# Patient Record
Sex: Male | Born: 1965 | Race: White | Hispanic: No | Smoking: Former smoker
Health system: Southern US, Community
[De-identification: ages and names within clinical notes are randomized; demographics above are authoritative.]

## PROBLEM LIST (undated history)

## (undated) DIAGNOSIS — Z8719 Personal history of other diseases of the digestive system: Secondary | ICD-10-CM

## (undated) DIAGNOSIS — E291 Testicular hypofunction: Secondary | ICD-10-CM

## (undated) DIAGNOSIS — N529 Male erectile dysfunction, unspecified: Secondary | ICD-10-CM

## (undated) DIAGNOSIS — G4730 Sleep apnea, unspecified: Secondary | ICD-10-CM

## (undated) DIAGNOSIS — M109 Gout, unspecified: Secondary | ICD-10-CM

## (undated) DIAGNOSIS — F411 Generalized anxiety disorder: Secondary | ICD-10-CM

## (undated) DIAGNOSIS — G4733 Obstructive sleep apnea (adult) (pediatric): Secondary | ICD-10-CM

## (undated) DIAGNOSIS — I428 Other cardiomyopathies: Secondary | ICD-10-CM

## (undated) DIAGNOSIS — I4819 Other persistent atrial fibrillation: Secondary | ICD-10-CM

## (undated) HISTORY — DX: Male erectile dysfunction, unspecified: N52.9

## (undated) HISTORY — DX: Gout, unspecified: M10.9

## (undated) HISTORY — DX: Generalized anxiety disorder: F41.1

## (undated) HISTORY — DX: Testicular hypofunction: E29.1

## (undated) HISTORY — PX: HERNIA REPAIR: SHX51

## (undated) HISTORY — DX: Other persistent atrial fibrillation: I48.19

## (undated) HISTORY — DX: Sleep apnea, unspecified: G47.30

## (undated) HISTORY — DX: Personal history of other diseases of the digestive system: Z87.19

## (undated) HISTORY — DX: Obstructive sleep apnea (adult) (pediatric): G47.33

---

## 2006-02-28 ENCOUNTER — Encounter: Admission: RE | Admit: 2006-02-28 | Discharge: 2006-02-28 | Payer: Self-pay | Admitting: Occupational Medicine

## 2009-03-05 ENCOUNTER — Ambulatory Visit (HOSPITAL_COMMUNITY): Admission: RE | Admit: 2009-03-05 | Discharge: 2009-03-05 | Payer: Self-pay | Admitting: Surgery

## 2009-03-21 ENCOUNTER — Encounter: Admission: RE | Admit: 2009-03-21 | Discharge: 2009-03-21 | Payer: Self-pay | Admitting: Occupational Medicine

## 2009-06-18 ENCOUNTER — Ambulatory Visit (HOSPITAL_COMMUNITY): Admission: RE | Admit: 2009-06-18 | Discharge: 2009-06-18 | Payer: Self-pay | Admitting: Surgery

## 2009-06-30 ENCOUNTER — Inpatient Hospital Stay (HOSPITAL_COMMUNITY): Admission: EM | Admit: 2009-06-30 | Discharge: 2009-07-03 | Payer: Self-pay | Admitting: Emergency Medicine

## 2010-12-10 LAB — URINE MICROSCOPIC-ADD ON

## 2010-12-10 LAB — URINALYSIS, ROUTINE W REFLEX MICROSCOPIC
Glucose, UA: NEGATIVE mg/dL
Leukocytes, UA: NEGATIVE
Nitrite: NEGATIVE
Specific Gravity, Urine: 1.03 (ref 1.005–1.030)
Urobilinogen, UA: 1 mg/dL (ref 0.0–1.0)

## 2010-12-10 LAB — DIFFERENTIAL
Basophils Absolute: 0 10*3/uL (ref 0.0–0.1)
Basophils Absolute: 0.1 10*3/uL (ref 0.0–0.1)
Basophils Relative: 0 % (ref 0–1)
Basophils Relative: 0 % (ref 0–1)
Basophils Relative: 1 % (ref 0–1)
Eosinophils Relative: 1 % (ref 0–5)
Lymphocytes Relative: 36 % (ref 12–46)
Lymphocytes Relative: 36 % (ref 12–46)
Lymphs Abs: 0.6 10*3/uL — ABNORMAL LOW (ref 0.7–4.0)
Monocytes Absolute: 0.6 10*3/uL (ref 0.1–1.0)
Monocytes Relative: 5 % (ref 3–12)
Neutro Abs: 4.9 10*3/uL (ref 1.7–7.7)
Neutro Abs: 3.3 10*3/uL (ref 1.7–7.7)
Neutrophils Relative %: 73 % (ref 43–77)

## 2010-12-10 LAB — CBC
HCT: 40.1 % (ref 39.0–52.0)
HCT: 40.7 % (ref 39.0–52.0)
HCT: 46.6 % (ref 39.0–52.0)
Hemoglobin: 13.9 g/dL (ref 13.0–17.0)
Hemoglobin: 14 g/dL (ref 13.0–17.0)
Hemoglobin: 16.5 g/dL (ref 13.0–17.0)
Hemoglobin: 15.7 g/dL (ref 13.0–17.0)
MCHC: 34.8 g/dL (ref 30.0–36.0)
MCHC: 35 g/dL (ref 30.0–36.0)
MCV: 87.2 fL (ref 78.0–100.0)
Platelets: 83 10*3/uL — ABNORMAL LOW (ref 150–400)
Platelets: 91 10*3/uL — ABNORMAL LOW (ref 150–400)
Platelets: 189 10*3/uL (ref 150–400)
RBC: 4.67 MIL/uL (ref 4.22–5.81)
RBC: 4.68 MIL/uL (ref 4.22–5.81)
RBC: 5.45 MIL/uL (ref 4.22–5.81)
RDW: 11.9 % (ref 11.5–15.5)
RDW: 12.7 % (ref 11.5–15.5)
WBC: 1.7 10*3/uL — ABNORMAL LOW (ref 4.0–10.5)
WBC: 7.7 10*3/uL (ref 4.0–10.5)
WBC: 8.6 10*3/uL (ref 4.0–10.5)
WBC: 6.4 10*3/uL (ref 4.0–10.5)

## 2010-12-10 LAB — COMPREHENSIVE METABOLIC PANEL
Albumin: 3.2 g/dL — ABNORMAL LOW (ref 3.5–5.2)
Albumin: 3.2 g/dL — ABNORMAL LOW (ref 3.5–5.2)
Alkaline Phosphatase: 43 U/L (ref 39–117)
Alkaline Phosphatase: 55 U/L (ref 39–117)
Alkaline Phosphatase: 61 U/L (ref 39–117)
BUN: 10 mg/dL (ref 6–23)
BUN: 10 mg/dL (ref 6–23)
BUN: 21 mg/dL (ref 6–23)
CO2: 28 mEq/L (ref 19–32)
CO2: 31 mEq/L (ref 19–32)
CO2: 32 mEq/L (ref 19–32)
Chloride: 105 mEq/L (ref 96–112)
Chloride: 99 mEq/L (ref 96–112)
Creatinine, Ser: 0.94 mg/dL (ref 0.4–1.5)
Creatinine, Ser: 1.02 mg/dL (ref 0.4–1.5)
GFR calc non Af Amer: 59 mL/min — ABNORMAL LOW (ref >60)
GFR calc non Af Amer: >60 mL/min (ref >60)
Glucose, Bld: 113 mg/dL — ABNORMAL HIGH (ref 70–99)
Glucose, Bld: 130 mg/dL — ABNORMAL HIGH (ref 70–99)
Potassium: 3.8 mEq/L (ref 3.5–5.1)
Potassium: 4 mEq/L (ref 3.5–5.1)
Potassium: 4.5 mEq/L (ref 3.5–5.1)
Sodium: 137 mEq/L (ref 135–145)
Sodium: 144 mEq/L (ref 135–145)
Total Bilirubin: 0.5 mg/dL (ref 0.3–1.2)
Total Bilirubin: 0.5 mg/dL (ref 0.3–1.2)
Total Protein: 6 g/dL (ref 6.0–8.3)

## 2010-12-10 LAB — ROCKY MTN SPOTTED FVR AB, IGM-BLOOD
RMSF IgM: 0.3 IV (ref 0.00–0.89)
RMSF IgM: 0.33 IV (ref 0.00–0.89)

## 2010-12-10 LAB — BASIC METABOLIC PANEL
BUN: 13 mg/dL (ref 6–23)
BUN: 18 mg/dL (ref 6–23)
CO2: 30 mEq/L (ref 19–32)
Calcium: 8.1 mg/dL — ABNORMAL LOW (ref 8.4–10.5)
Calcium: 9.4 mg/dL (ref 8.4–10.5)
GFR calc Af Amer: >60 mL/min (ref >60)
GFR calc non Af Amer: 49 mL/min — ABNORMAL LOW (ref >60)
Glucose, Bld: 119 mg/dL — ABNORMAL HIGH (ref 70–99)
Glucose, Bld: 103 mg/dL — ABNORMAL HIGH (ref 70–99)
Potassium: 3.7 mEq/L (ref 3.5–5.1)
Sodium: 139 mEq/L (ref 135–145)
Sodium: 140 mEq/L (ref 135–145)

## 2010-12-10 LAB — CULTURE, BLOOD (ROUTINE X 2): Culture: NO GROWTH

## 2010-12-10 LAB — MAGNESIUM
Magnesium: 2 mg/dL (ref 1.5–2.5)
Magnesium: 2.1 mg/dL (ref 1.5–2.5)

## 2010-12-10 LAB — PROTIME-INR: INR: 0.85 (ref 0.00–1.49)

## 2010-12-10 LAB — URINE CULTURE: Culture: NO GROWTH

## 2010-12-14 LAB — CBC
HCT: 47.7 % (ref 39.0–52.0)
Hemoglobin: 16.4 g/dL (ref 13.0–17.0)
MCV: 86.5 fL (ref 78.0–100.0)
RBC: 5.51 MIL/uL (ref 4.22–5.81)
WBC: 7.2 10*3/uL (ref 4.0–10.5)

## 2010-12-14 LAB — BASIC METABOLIC PANEL
Chloride: 103 mEq/L (ref 96–112)
GFR calc Af Amer: >60 mL/min (ref >60)
Potassium: 4.5 mEq/L (ref 3.5–5.1)

## 2010-12-14 LAB — DIFFERENTIAL
Eosinophils Absolute: 0.2 10*3/uL (ref 0.0–0.7)
Eosinophils Relative: 2 % (ref 0–5)
Lymphs Abs: 2.6 10*3/uL (ref 0.7–4.0)
Monocytes Relative: 8 % (ref 3–12)

## 2011-01-19 NOTE — Op Note (Signed)
NAME:  Larry Evans, Larry Evans NO.:  [id number]   MEDICAL RECORD NO.:  [id number]          PATIENT TYPE:  AMB   LOCATION:  SDS                          FACILITY:  [facility]   PHYSICIAN:  Wilmon Arms. Corliss Skains, M.D. DATE OF BIRTH:  Jun 21, 1966   DATE OF PROCEDURE:  03/05/2009  DATE OF DISCHARGE:                               OPERATIVE REPORT   PREOPERATIVE DIAGNOSIS:  Right inguinal hernia.   POSTOPERATIVE DIAGNOSIS:  Right pantaloon inguinal hernia.   PROCEDURE PERFORMED:  Right inguinal hernia repair with mesh.   SURGEON:  Wilmon Arms. Tsuei, MD   ANESTHESIA:  General endotracheal.  No assistance.   INDICATIONS:  This is a 45 year old male who presents with at least a 4-  year history of a visible palpable bulge in his right groin.  Unfortunately, the patient did not have this attended to and the hernia  has gotten much larger and is protruding down into his scrotum.  He  presents now for elective repair.   DESCRIPTION OF PROCEDURE:  The patient was brought to the operating  room, placed in supine position on the operating room table.  After an  adequate level of general anesthesia was obtained, the patient's right  groin was shaved, prepped with Betadine and draped in sterile fashion.  A time-out was taken to assure the proper patient and proper procedure.  We infiltrated the area above the right inguinal ligament with  0.25%Marcaine with epinephrine.  We made an oblique incision above the  inguinal ligament.  Dissection was carried down through the subcutaneous  tissues to the external oblique fascia.  The fascia was opened along  direction of its fibers down the external ring.  We bluntly dissected  around the very large spermatic cord.  There was an obvious large direct  hernia sac protruding up to the floor of the inguinal canal.  The  spermatic cord was retracted with a Penrose drain.  We dissected the  direct hernia sac free and reduced it back up to the direct defect  in  the floor of the inguinal canal.  The spermatic cord was then  skeletonized and very large indirect hernia sac was also reduced along  with a cord lipoma.  We did reduce this up to the internal ring.  The  floor of the inguinal canal was then reapproximated and reinforced with  a running 0 PDS suture.  The internal inguinal ring was tightened with a  2-0 Vicryl.  UltraPro mesh was cut into a keyhole shape and was secured  with 2-0 Prolene beginning at the pubic tubercle running it along the  shelving edge inferiorly and the internal oblique fascia superiorly.  The tails were sutured together behind the spermatic cord and tucked  underneath the external oblique fascia.  The fascia was reapproximated  with 2-0 Vicryl.  A 3-0 Vicryl was used to close subcutaneous tissues  and 4-0 Monocryl was used to close the skin.  Steri-Strips and clean  dressings were applied.  The patient was then extubated and brought to  recovery room in stable condition.  All sponge, instrument, and needle  counts  were correct.     Wilmon Arms. Tsuei, M.D.  Electronically Signed    MKT/MEDQ  D:  03/05/2009  T:  03/06/2009  Job:  [id number]

## 2011-03-30 IMAGING — US US SCROTUM
1 series · 14 of 25 positions shown · non-contrast
Comparison: CT scan earlier today

CLINICAL DATA: Fever, weakness previous right hernia repair.

SCROTAL ULTRASOUND
DOPPLER ULTRASOUND OF THE TESTICLES
TECHNIQUE: Complete ultrasound examination of the testicles,
epididymis, and other scrotal structures was performed.  Color and
spectral Doppler ultrasound were also utilized to evaluate blood
flow to the testicles.

[Series 1: us scrotum · 0.05mm/px · 14 of 57 slices shown]
[im 1/57]
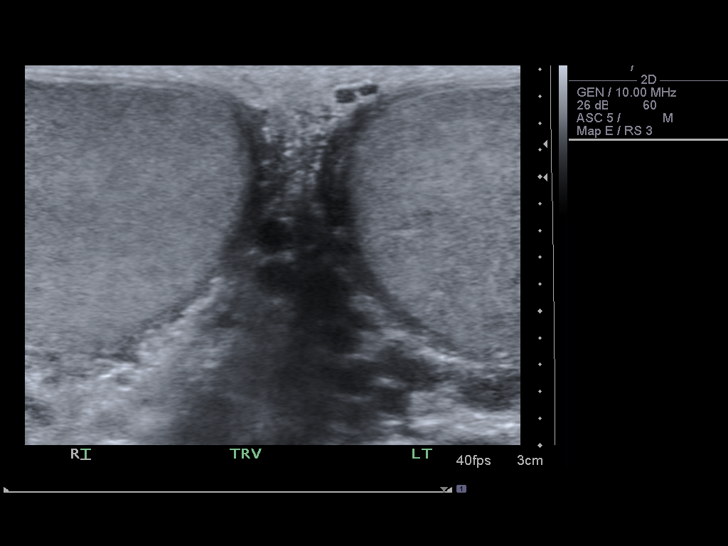
[im 5/57]
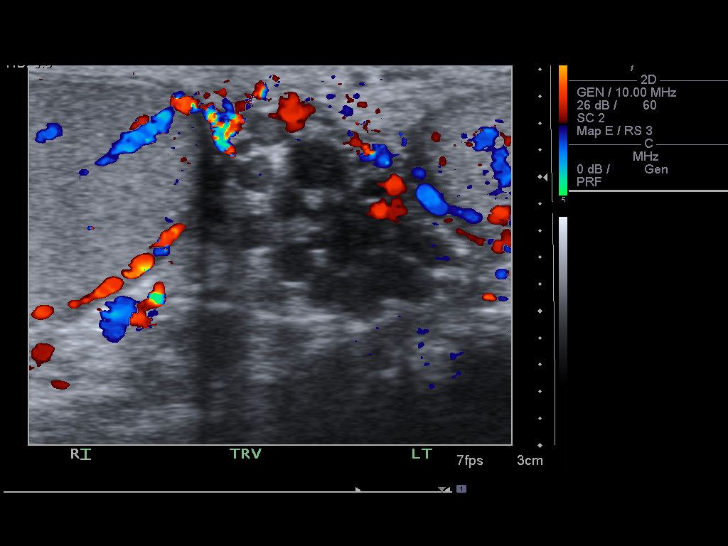
[im 10/57]
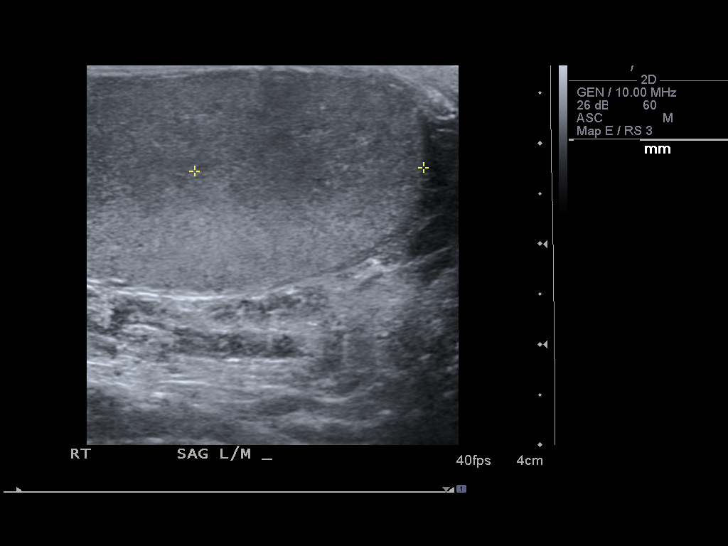
[im 15/57]
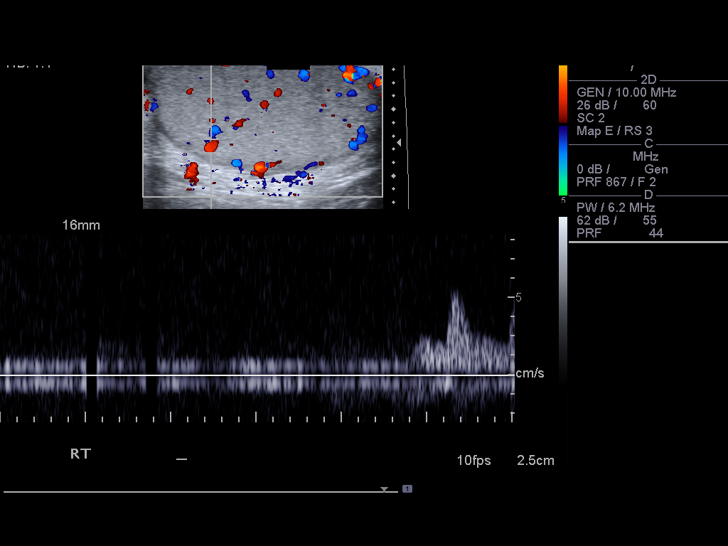
[im 19/57]
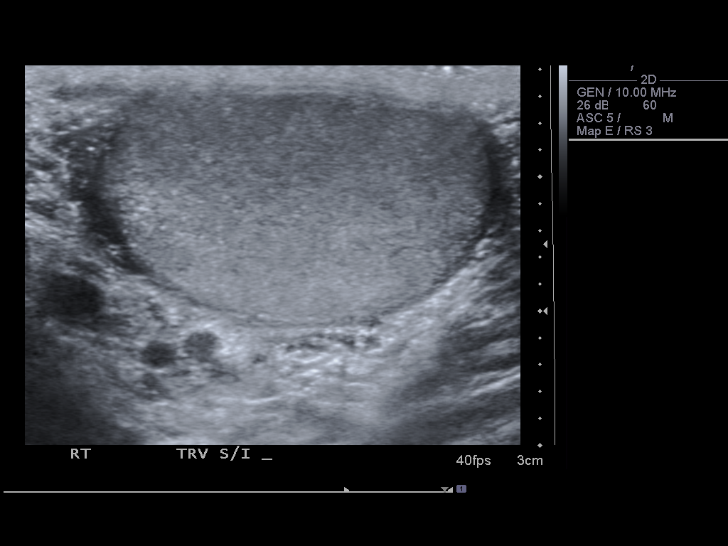
[im 22/57]
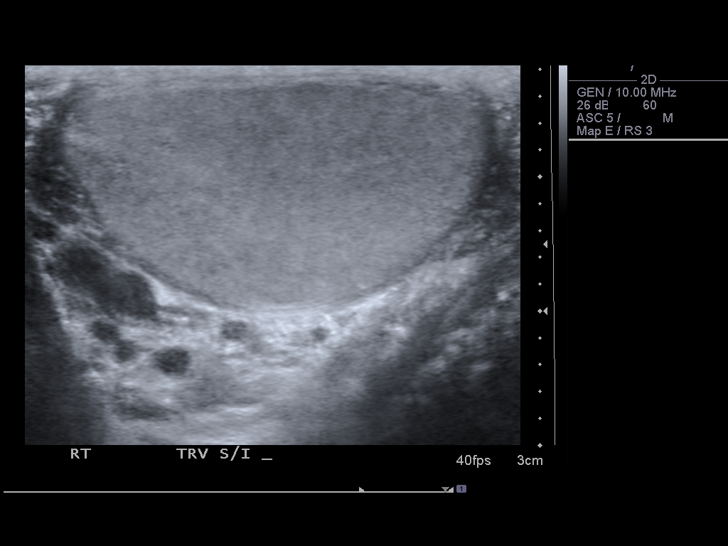
[im 26/57]
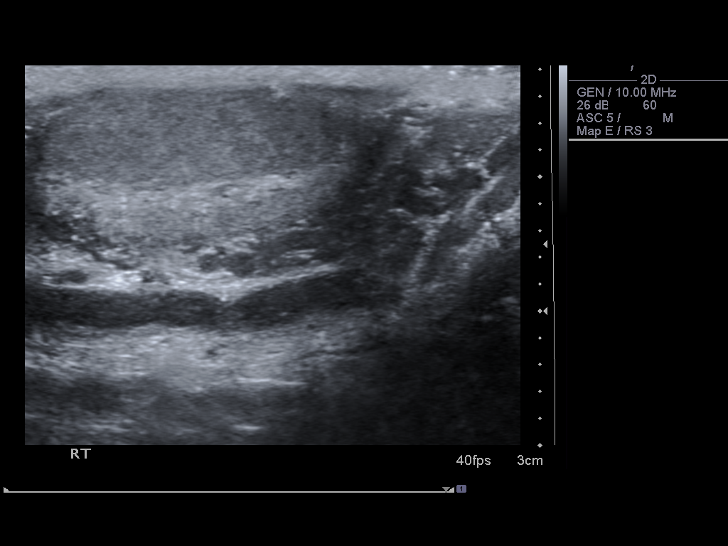
[im 31/57]
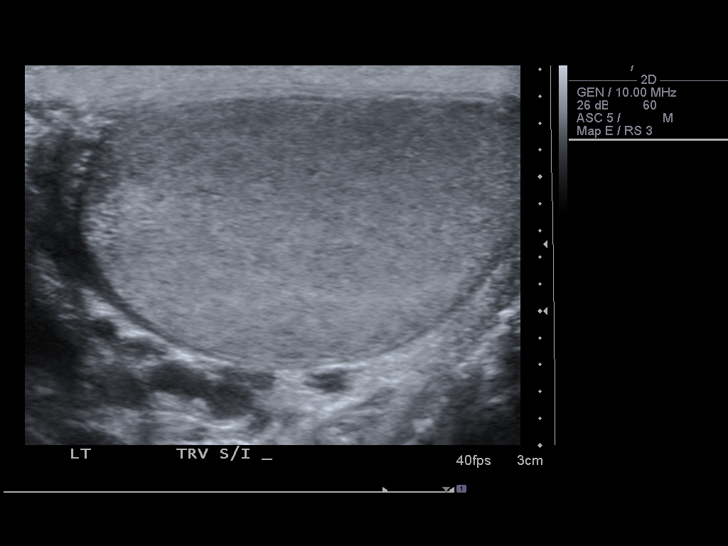
[im 36/57]
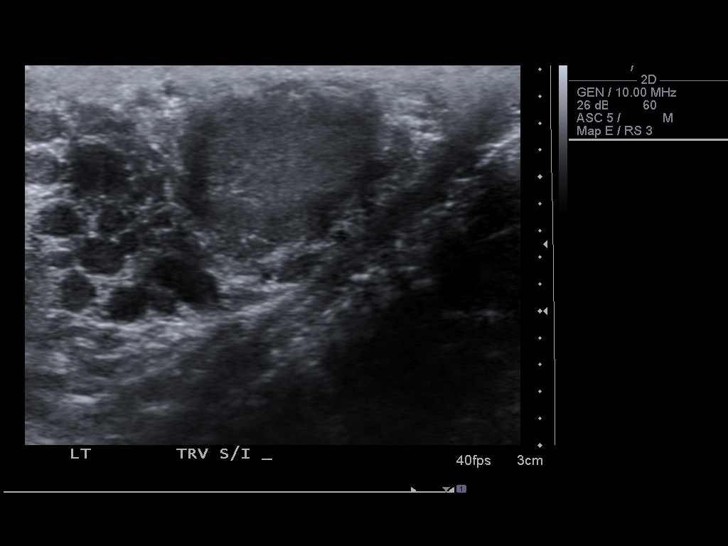
[im 38/57]
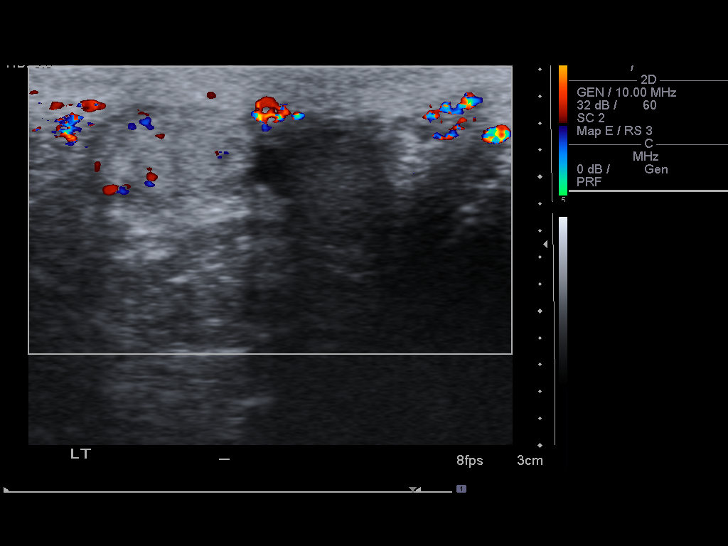
[im 43/57]
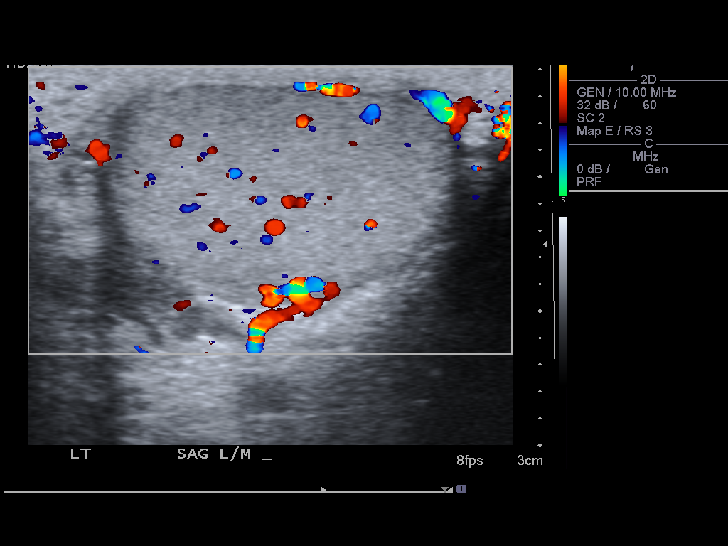
[im 47/57]
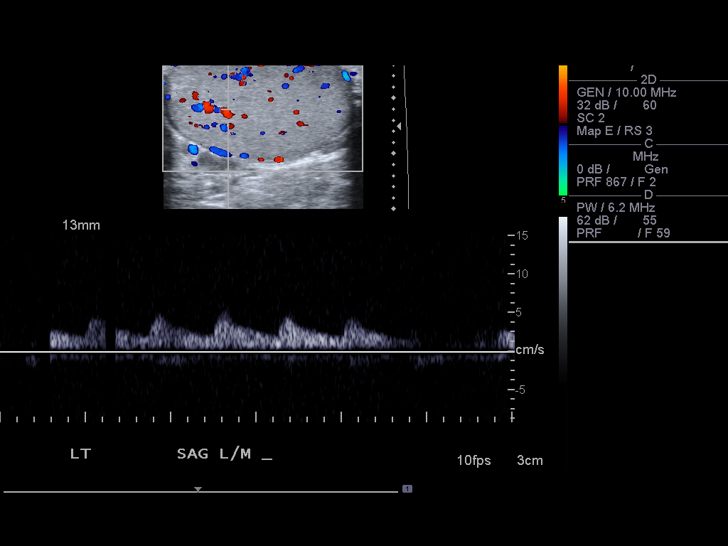
[im 52/57]
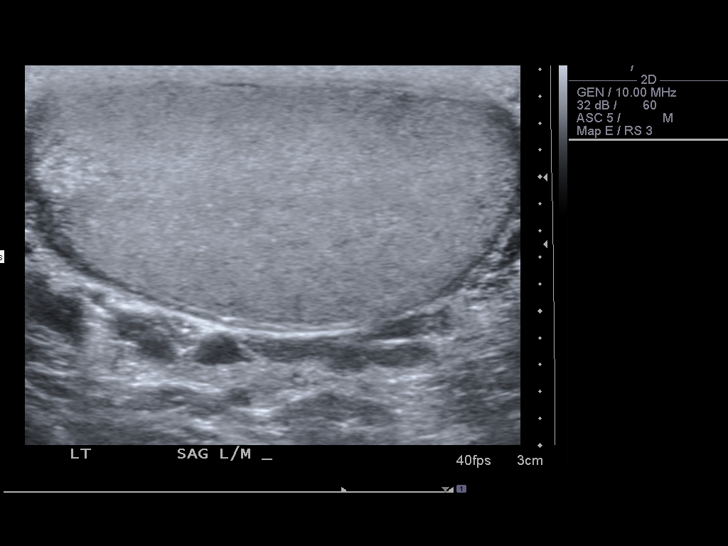
[im 57/57]
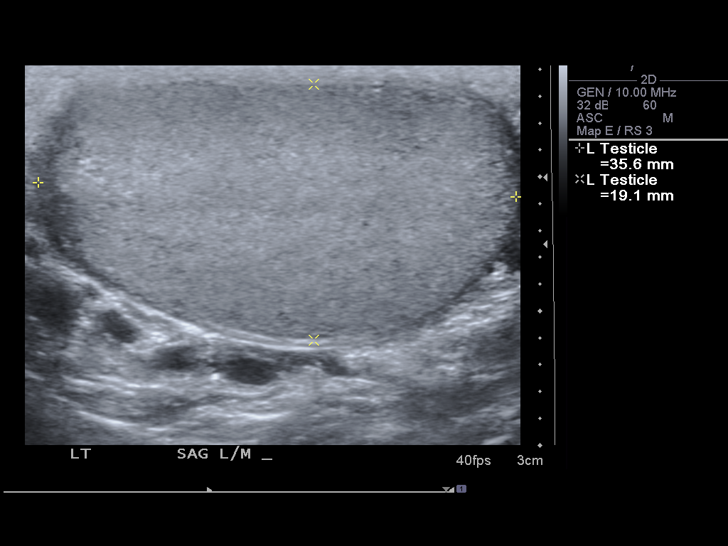

[14 of 25 positions shown; findings below may reference images not displayed]

FINDINGS: The right testicle is slightly larger than the left but
there are no focal masses.  Both testicle demonstrate normal
internal architecture.  There is no evidence for increased or
decreased flow on color Doppler within the testicles to suggest
orchitis or torsion.

Numerous vascular channels are seen between the testicles which do
not demonstrate augmented flow on Doppler.  I feel this is likely
to represent a static varicocele.  Other considerations would
include dilated lymphatic channels.  Correlate clinically.

Small hydrocele on the right.  No evidence for epididymitis.
IMPRESSION: No evidence for orchitis or torsion.

Dilated vascular channels without demonstrable color flow may
represent static varicocele.  Correlate clinically.

Small hydrocele on the right.

## 2012-02-02 ENCOUNTER — Other Ambulatory Visit: Payer: Self-pay | Admitting: Internal Medicine

## 2013-08-31 ENCOUNTER — Encounter (INDEPENDENT_AMBULATORY_CARE_PROVIDER_SITE_OTHER): Payer: Self-pay

## 2013-08-31 ENCOUNTER — Ambulatory Visit (INDEPENDENT_AMBULATORY_CARE_PROVIDER_SITE_OTHER): Payer: Managed Care, Other (non HMO) | Admitting: Cardiology

## 2013-08-31 ENCOUNTER — Encounter: Payer: Self-pay | Admitting: Cardiology

## 2013-08-31 VITALS — BP 132/100 | HR 50 | Ht 72.0 in | Wt 241.0 lb

## 2013-08-31 DIAGNOSIS — I480 Paroxysmal atrial fibrillation: Secondary | ICD-10-CM | POA: Insufficient documentation

## 2013-08-31 DIAGNOSIS — I4891 Unspecified atrial fibrillation: Secondary | ICD-10-CM

## 2013-08-31 LAB — CBC WITH DIFFERENTIAL/PLATELET
Basophils Relative: 0.6 % (ref 0.0–3.0)
Eosinophils Absolute: 0.3 10*3/uL (ref 0.0–0.7)
MCHC: 33.7 g/dL (ref 30.0–36.0)
MCV: 88.4 fl (ref 78.0–100.0)
Monocytes Absolute: 0.6 10*3/uL (ref 0.1–1.0)
Neutrophils Relative %: 55.3 % (ref 43.0–77.0)
Platelets: 218 10*3/uL (ref 150.0–400.0)
RBC: 5.64 Mil/uL (ref 4.22–5.81)

## 2013-08-31 LAB — BASIC METABOLIC PANEL
BUN: 17 mg/dL (ref 6–23)
Calcium: 9.6 mg/dL (ref 8.4–10.5)
Creatinine, Ser: 1.1 mg/dL (ref 0.4–1.5)
GFR: 79.01 mL/min (ref >60.00)
Potassium: 4.1 mEq/L (ref 3.5–5.1)

## 2013-08-31 LAB — HEPATIC FUNCTION PANEL
AST: 20 U/L (ref 0–37)
Total Bilirubin: 0.9 mg/dL (ref 0.3–1.2)

## 2013-08-31 LAB — TSH: TSH: 2.52 u[IU]/mL (ref 0.35–5.50)

## 2013-08-31 MED ORDER — FUROSEMIDE 20 MG PO TABS: 20.0000 mg | ORAL_TABLET | Freq: Once | ORAL | Status: DC

## 2013-08-31 MED ORDER — METOPROLOL SUCCINATE ER 50 MG PO TB24: 50.0000 mg | ORAL_TABLET | Freq: Every day | ORAL | Status: DC

## 2013-08-31 MED ORDER — APIXABAN 5 MG PO TABS: 5.0000 mg | ORAL_TABLET | Freq: Two times a day (BID) | ORAL | Status: DC

## 2013-08-31 NOTE — Patient Instructions (Signed)
Your physician recommends that you schedule a follow-up appointment in: ONE WEEK WITH EXTENDER OR DOD TO CHECK HEART RATE  Your physician recommends that you schedule a follow-up appointment in: 4 WEEKS WITH DR Jens Som  Your physician has requested that you have an echocardiogram. Echocardiography is a painless test that uses sound waves to create images of your heart. It provides your doctor with information about the size and shape of your heart and how well your heart's chambers and valves are working. This procedure takes approximately one hour. There are no restrictions for this procedure.   Your physician recommends that you HAVE LAB WORK TODAY  START METOPROLOL SUCC ER 50 MG ONCE DAILY  TAKE FUROSEMIDE 20 MG TODAY  START ELIQUIS 5 MG ONE TABLET TWICE DAILY

## 2013-08-31 NOTE — Progress Notes (Signed)
     HPI: 47 year old male for evaluation of atrial fibrillation. No prior cardiac history. Patient has had intermittent palpitations for approximately 10 years. They're not related to activities. It typically lasts approximately 15 minutes and resolve with deep breaths and rest. He developed recurrent palpitations approximately 7 days ago. He also has noticed mild increased dyspnea on exertion, orthopnea. He has not had chest pain. He presented to her primary care and was noted to be in atrial fibrillation and we were asked to evaluate. He otherwise does not have dyspnea on exertion, orthopnea, PND, pedal edema, syncope or exertional chest pain. No history of bleeding.  No current outpatient prescriptions on file.   No current facility-administered medications for this visit.    No Known Allergies  History reviewed. No pertinent past medical history.  Past Surgical History  Procedure Laterality Date  . Hernia repair      History   Social History  . Marital Status: Divorced    Spouse Name: N/A    Number of Children: 1  . Years of Education: N/A   Occupational History  .      Educational psychologist   Social History Main Topics  . Smoking status: Former Games developer  . Smokeless tobacco: Not on file  . Alcohol Use: Yes     Comment: occasionally  . Drug Use: No  . Sexual Activity: Not on file   Other Topics Concern  . Not on file   Social History Narrative  . No narrative on file    Family History  Problem Relation Age of Onset  . Heart disease      No family history    ROS: no fevers or chills, productive cough, hemoptysis, dysphasia, odynophagia, melena, hematochezia, dysuria, hematuria, rash, seizure activity, orthopnea, PND, pedal edema, claudication. Remaining systems are negative.  Physical Exam:   Blood pressure 132/100, pulse 50, height 6' (1.829 m), weight 241 lb (109.317 kg), SpO2 97.00%.  General:  Well developed/well nourished in NAD Skin warm/dry Patient not  depressed No peripheral clubbing Back-normal HEENT-normal/normal eyelids Neck supple/normal carotid upstroke bilaterally; no bruits; no JVD; no thyromegaly chest - CTA/ normal expansion CV - tachycardic and irregular/normal S1 and S2; no murmurs, rubs or gallops;  PMI nondisplaced Abdomen -NT/ND, no HSM, no mass, + bowel sounds, no bruit 2+ femoral pulses, no bruits Ext-no edema, chords, 2+ DP Neuro-grossly nonfocal  ECG atrial fibrillation with a rapid ventricular response.

## 2013-08-31 NOTE — Assessment & Plan Note (Signed)
Patient presents with newly diagnosed atrial fibrillation. Plan echocardiogram to assess LV function. Check TSH, CBC and CMET. His rate is mildly increased. Add Toprol 50 mg daily. He has mild dyspnea and orthopnea but does not appear to be significantly volume overloaded. I will give Lasix 20 mg by mouth x1. His symptoms should improve with rate control. He has no embolic risk factors. I will begin apixaban 5 mg by mouth twice a day in case he does not convert on his own. If not we will plan to proceed with cardioversion 3 weeks after initiating anticoagulation and continuing apixaban for 4 weeks afterwards. I do not think he will require long-term anticoagulation unless his LV function is reduced on echocardiogram. He does consume significant amounts of alcohol. I have explained this could be contributing to his atrial fibrillation and asked him to decrease his intake. I will have him seen next week to have his blood pressure checked and his heart rate. Note he also apparently snores significantly. This could also be contributing to atrial fibrillation. We will arrange outpatient pulmonary evaluation to rule out sleep apnea once his atrial fibrillation is treated.

## 2013-09-03 ENCOUNTER — Encounter: Payer: Self-pay | Admitting: *Deleted

## 2013-09-11 ENCOUNTER — Ambulatory Visit (INDEPENDENT_AMBULATORY_CARE_PROVIDER_SITE_OTHER): Payer: Managed Care, Other (non HMO) | Admitting: Physician Assistant

## 2013-09-11 ENCOUNTER — Encounter: Payer: Self-pay | Admitting: Physician Assistant

## 2013-09-11 VITALS — BP 132/100 | HR 127 | Ht 72.0 in | Wt 245.0 lb

## 2013-09-11 DIAGNOSIS — R0989 Other specified symptoms and signs involving the circulatory and respiratory systems: Secondary | ICD-10-CM

## 2013-09-11 DIAGNOSIS — R0609 Other forms of dyspnea: Secondary | ICD-10-CM

## 2013-09-11 DIAGNOSIS — R0683 Snoring: Secondary | ICD-10-CM

## 2013-09-11 DIAGNOSIS — I4891 Unspecified atrial fibrillation: Secondary | ICD-10-CM

## 2013-09-11 MED ORDER — METOPROLOL SUCCINATE ER 50 MG PO TB24: 50.0000 mg | ORAL_TABLET | Freq: Two times a day (BID) | ORAL | Status: DC

## 2013-09-11 NOTE — Patient Instructions (Addendum)
INCREASE YOUR TOPROL (METOPROLOL) TO 50 MG TWICE A DAY  You are scheduled to see Dr Gwenette Greet on 10-09-13 at 2:00. They are located at [address], phone number [phone] Please arrive at least 15 minutes early with a list of your medications and your insurance cards  You will see Nicki Reaper next week when you come for your Echo

## 2013-09-11 NOTE — Progress Notes (Signed)
[address] [place], [place]  [address] Phone: [phone] Fax:  [phone]  Date:  09/11/2013   ID:  Larry Evans, Larry Evans 1966-08-18, MRN [id number]  PCP:  Milagros Evener, MD  Cardiologist:  Dr. Kirk Ruths     History of Present Illness: Larry Evans is a 48 y.o. male who was recently dx with atrial fibrillation. He has no prior cardiac history. Patient has had intermittent palpitations for approximately 10 years. They're not related to activities. It typically lasts approximately 15 minutes and resolve with deep breaths and rest. He developed recurrent palpitations recently along with mild increased dyspnea on exertion, orthopnea.  He presented to primary care and was noted to be in atrial fibrillation.  He saw Dr. Kirk Ruths 08/31/13.  He was placed on Toprol for rate control. He was placed on Apixaban for anticoagulation. He was given one dose of Lasix for volume excess. Plan is to eventually proceed with cardioversion after 3 weeks of uninterrupted anticoagulation. Of note, TSH was normal. He has an echocardiogram pending. He will eventually need outpatient evaluation for sleep apnea.  He is feeling better. His breathing has improved. He describes NYHA class II symptoms. He denies orthopnea or PND. He denies edema. He denies chest pain. He denies syncope or near-syncope. He does note his heart rate is elevated at times.  Recent Labs: 08/31/2013: ALT 30; Creatinine 1.1; Hemoglobin 16.8; Potassium 4.1; TSH 2.52   Wt Readings from Last 3 Encounters:  09/11/13 245 lb (111.131 kg)  08/31/13 241 lb (109.317 kg)     Past Medical History  Diagnosis Date  . Atrial fibrillation     a. dx 08/2013 => Apixaban started 08/31/13    Current Outpatient Prescriptions  Medication Sig Dispense Refill  . apixaban (ELIQUIS) 5 MG TABS tablet Take 1 tablet (5 mg total) by mouth 2 (two) times daily.  60 tablet  6  . furosemide (LASIX) 20 MG tablet Take 1 tablet (20 mg  total) by mouth once.  1 tablet  0  . metoprolol succinate (TOPROL-XL) 50 MG 24 hr tablet Take 1 tablet (50 mg total) by mouth daily. Take with or immediately following a meal.  30 tablet  12   No current facility-administered medications for this visit.    Allergies:   Review of patient's allergies indicates no known allergies.   Social History:  The patient  reports that he has quit smoking. He does not have any smokeless tobacco history on file. He reports that he drinks alcohol. He reports that he does not use illicit drugs.   Family History:  The patient's family history includes Heart disease in an other family member.   ROS:  Please see the history of present illness.   He denies any bleeding problems.   All other systems reviewed and negative.   PHYSICAL EXAM: VS:  BP 132/100  Pulse 127  Ht 6' (1.829 m)  Wt 245 lb (111.131 kg)  BMI 33.22 kg/m2 Well nourished, well developed, in no acute distress HEENT: normal Neck: no JVD Cardiac:  normal S1, S2; irregularly irregular rhythm; no murmur Lungs:  clear to auscultation bilaterally, no wheezing, rhonchi or rales Abd: soft, nontender, no hepatomegaly Ext: no edema Skin: warm and dry Neuro:  CNs 2-12 intact, no focal abnormalities noted  EKG:  Atrial fibrillation, HR 127, normal axis     ASSESSMENT AND PLAN:  1. Atrial Fibrillation:  Heart rate is still uncontrolled. He has symptomatically improved. I will increase  his Toprol to 50 mg twice a day. He will proceed with echocardiogram next week as planned. Volume status appears stable at this time. He remains on Apixaban. We discussed the importance of uninterrupted anticoagulation before proceeding with cardioversion.  BP has been significantly elevated his last 2 visits.  HTN may be one of his risk factors (ie CHADS2-VASc=1).  Continue to monitor.   2. Snoring: He likely has significant sleep apnea. I will refer him to pulmonology for further evaluation. 3. Disposition:  Follow  up in one week to recheck his heart rate. Keep follow up with Dr. Stanford Breed 1/22 as planned.  Signed, Richardson Dopp, PA-C  09/11/2013 8:55 AM

## 2013-09-18 ENCOUNTER — Ambulatory Visit (HOSPITAL_COMMUNITY): Payer: Managed Care, Other (non HMO) | Attending: Cardiology | Admitting: Radiology

## 2013-09-18 ENCOUNTER — Encounter: Payer: Self-pay | Admitting: Cardiovascular Disease

## 2013-09-18 ENCOUNTER — Ambulatory Visit (INDEPENDENT_AMBULATORY_CARE_PROVIDER_SITE_OTHER): Payer: Managed Care, Other (non HMO) | Admitting: Physician Assistant

## 2013-09-18 ENCOUNTER — Encounter: Payer: Self-pay | Admitting: Physician Assistant

## 2013-09-18 VITALS — BP 130/82 | HR 132 | Ht 72.0 in | Wt 243.0 lb

## 2013-09-18 DIAGNOSIS — R0683 Snoring: Secondary | ICD-10-CM

## 2013-09-18 DIAGNOSIS — M25579 Pain in unspecified ankle and joints of unspecified foot: Secondary | ICD-10-CM

## 2013-09-18 DIAGNOSIS — M25572 Pain in left ankle and joints of left foot: Secondary | ICD-10-CM

## 2013-09-18 DIAGNOSIS — R0989 Other specified symptoms and signs involving the circulatory and respiratory systems: Secondary | ICD-10-CM

## 2013-09-18 DIAGNOSIS — I4891 Unspecified atrial fibrillation: Secondary | ICD-10-CM | POA: Insufficient documentation

## 2013-09-18 DIAGNOSIS — R0609 Other forms of dyspnea: Secondary | ICD-10-CM

## 2013-09-18 MED ORDER — FUROSEMIDE 20 MG PO TABS: 20.0000 mg | ORAL_TABLET | Freq: Once | ORAL | Status: DC

## 2013-09-18 MED ORDER — DILTIAZEM HCL ER COATED BEADS 120 MG PO CP24: 120.0000 mg | ORAL_CAPSULE | Freq: Every day | ORAL | Status: DC

## 2013-09-18 NOTE — Progress Notes (Signed)
Echocardiogram performed.  

## 2013-09-18 NOTE — Progress Notes (Signed)
[address], [place], [place]  [address] Phone: [phone] Fax:  [phone]  Date:  09/18/2013   ID:  Larry Evans, Larry Evans [birth year], MRN [id number]  PCP:  Milagros Evener, MD  Cardiologist:  Dr. Kirk Ruths     History of Present Illness: Larry Evans is a 48 y.o. male who was recently dx with atrial fibrillation. He has no prior cardiac history.   He saw Dr. Kirk Ruths 08/31/13.  He was placed on Toprol for rate control. He was placed on Apixaban for anticoagulation. He was given one dose of Lasix for volume excess. Plan is to eventually proceed with cardioversion after 3 weeks of uninterrupted anticoagulation. Of note, TSH was normal. He has an echocardiogram pending.  I saw him last week. His heart rate was still uncontrolled. I adjusted his Toprol. I referred him to pulmonology for evaluation of sleep apnea.  He returns for follow up.  He notes some increased dyspnea over the last day. No orthopnea or PND.  He has a mild NP cough.  No chest pain. No syncope.  He notes L ankle edema and pain.  This occurs from time to time and lasts about 3 days.  He has refrained form ETOH since seeing Dr. Kirk Ruths.  He is compliant with Toprol and Apixaban.    Recent Labs: 08/31/2013: ALT 30; Creatinine 1.1; Hemoglobin 16.8; Potassium 4.1; TSH 2.52   Wt Readings from Last 3 Encounters:  09/18/13 243 lb (110.224 kg)  09/11/13 245 lb (111.131 kg)  08/31/13 241 lb (109.317 kg)     Past Medical History  Diagnosis Date  . Atrial fibrillation     a. dx 08/2013 => Apixaban started 08/31/13    Current Outpatient Prescriptions  Medication Sig Dispense Refill  . apixaban (ELIQUIS) 5 MG TABS tablet Take 1 tablet (5 mg total) by mouth 2 (two) times daily.  60 tablet  6  . metoprolol succinate (TOPROL-XL) 50 MG 24 hr tablet Take 1 tablet (50 mg total) by mouth 2 (two) times daily. Take with or immediately following a meal.  60 tablet  11   No current  facility-administered medications for this visit.    Allergies:   Review of patient's allergies indicates no known allergies.   Social History:  The patient  reports that he has quit smoking. He does not have any smokeless tobacco history on file. He reports that he drinks alcohol. He reports that he does not use illicit drugs.   Family History:  The patient's family history includes Heart disease in an other family member.   ROS:  Please see the history of present illness.  He denies any bleeding problems.   All other systems reviewed and negative.   PHYSICAL EXAM: VS:  BP 130/82  Pulse 132  Ht 6' (1.829 m)  Wt 243 lb (110.224 kg)  BMI 32.95 kg/m2 Well nourished, well developed, in no acute distress HEENT: normal Neck: minimally elevated JVD Cardiac:  normal S1, S2; irregularly irregular rhythm; no murmur Lungs:  clear to auscultation bilaterally, no wheezing, rhonchi or rales Abd: soft, nontender, no hepatomegaly Ext: trace L ankle edema; faint erythema noted Skin: warm and dry Neuro:  CNs 2-12 intact, no focal abnormalities noted  EKG:  AFib, HR 132     ASSESSMENT AND PLAN:  1. Atrial Fibrillation:  Rate is still uncontrolled.  He is compliant with medications.  He may have developed mild volume overload again.  I will give him one dose  of Lasix 20 mg.  He can take PRN after that.  Continue current dose of Toprol.  Add Diltiazem CD 120 mg QD.  Continue Eliquis.  Keep follow up with Dr. Kirk Ruths 1/22.  He will likely need to be set up for DCCV at that time.  Echo was done today.  Results are pending.   2. L Ankle Pain:  I suspect he has gout.  Check CBC, BMET, Uric Acid.  He can follow up with primary care. 3. Snoring:  He has an evaluation with Dr. Gwenette Greet next month.  4. Disposition:  Follow up with Dr. Stanford Breed 1/22 as planned.  Signed, Richardson Dopp, PA-C  09/18/2013 4:08 PM

## 2013-09-18 NOTE — Patient Instructions (Signed)
LAB WORK TODAY; BMET, CBC W/DIFF  AN RX FOR LASIX 20 MG TAKE 1 TIME ONLY THEN ONLY TAKE AS NEEDED FOR INCREASED SWELLING  START DILTIAZEM CD 120 MG DAILY  MAKE SURE TO KEEP YOUR APPT WITH DR. CRENSHAW 09/27/13

## 2013-09-19 ENCOUNTER — Other Ambulatory Visit: Payer: Self-pay | Admitting: *Deleted

## 2013-09-19 ENCOUNTER — Ambulatory Visit: Payer: Managed Care, Other (non HMO) | Admitting: Physician Assistant

## 2013-09-19 LAB — CBC WITH DIFFERENTIAL/PLATELET
BASOS ABS: 0.1 10*3/uL (ref 0.0–0.1)
Basophils Relative: 1 % (ref 0.0–3.0)
EOS ABS: 0.2 10*3/uL (ref 0.0–0.7)
Eosinophils Relative: 2.9 % (ref 0.0–5.0)
HCT: 46.5 % (ref 39.0–52.0)
Hemoglobin: 16 g/dL (ref 13.0–17.0)
LYMPHS PCT: 35 % (ref 12.0–46.0)
Lymphs Abs: 2.9 10*3/uL (ref 0.7–4.0)
MCHC: 34.5 g/dL (ref 30.0–36.0)
MCV: 88.4 fl (ref 78.0–100.0)
Monocytes Absolute: 0.6 10*3/uL (ref 0.1–1.0)
Monocytes Relative: 7.4 % (ref 3.0–12.0)
NEUTROS ABS: 4.5 10*3/uL (ref 1.4–7.7)
NEUTROS PCT: 53.7 % (ref 43.0–77.0)
PLATELETS: 227 10*3/uL (ref 150.0–400.0)
RBC: 5.26 Mil/uL (ref 4.22–5.81)
RDW: 12.7 % (ref 11.5–14.6)
WBC: 8.4 10*3/uL (ref 4.5–10.5)

## 2013-09-19 LAB — BASIC METABOLIC PANEL
BUN: 24 mg/dL — AB (ref 6–23)
CALCIUM: 9.2 mg/dL (ref 8.4–10.5)
CO2: 27 mEq/L (ref 19–32)
CREATININE: 1.2 mg/dL (ref 0.4–1.5)
Chloride: 106 mEq/L (ref 96–112)
GFR: 68.45 mL/min (ref >60.00)
GLUCOSE: 89 mg/dL (ref 70–99)
Potassium: 4.6 mEq/L (ref 3.5–5.1)
Sodium: 139 mEq/L (ref 135–145)

## 2013-09-19 LAB — URIC ACID: URIC ACID, SERUM: 8.2 mg/dL — AB (ref 4.0–7.8)

## 2013-09-19 MED ORDER — COLCHICINE 0.6 MG PO TABS: ORAL_TABLET | ORAL | Status: DC

## 2013-09-27 ENCOUNTER — Encounter: Payer: Self-pay | Admitting: *Deleted

## 2013-09-27 ENCOUNTER — Other Ambulatory Visit: Payer: Self-pay | Admitting: Cardiology

## 2013-09-27 ENCOUNTER — Ambulatory Visit (INDEPENDENT_AMBULATORY_CARE_PROVIDER_SITE_OTHER): Payer: Managed Care, Other (non HMO) | Admitting: Cardiology

## 2013-09-27 ENCOUNTER — Encounter: Payer: Self-pay | Admitting: Cardiology

## 2013-09-27 VITALS — BP 139/98 | HR 120 | Wt 243.0 lb

## 2013-09-27 DIAGNOSIS — I4891 Unspecified atrial fibrillation: Secondary | ICD-10-CM

## 2013-09-27 DIAGNOSIS — I420 Dilated cardiomyopathy: Secondary | ICD-10-CM | POA: Insufficient documentation

## 2013-09-27 DIAGNOSIS — I428 Other cardiomyopathies: Secondary | ICD-10-CM

## 2013-09-27 NOTE — Progress Notes (Signed)
      HPI: FU atrial fibrillation. Initially seen in December of 2014 for atrial fibrillation. TSH was normal. Echocardiogram in January of 2015 showed an ejection fraction of 35-40%. The aortic root was mildly dilated. There was moderate left atrial enlargement and mild to moderate right atrial enlargement. Patient was started on apixaban on December 26. He has also been treated with Toprol and Cardizem. At present he has occasional orthopnea but no PND, pedal edema, palpitations, syncope or chest pain. He has not taken his Toprol or Cardizem today.   Current Outpatient Prescriptions  Medication Sig Dispense Refill  . apixaban (ELIQUIS) 5 MG TABS tablet Take 1 tablet (5 mg total) by mouth 2 (two) times daily.  60 tablet  6  . colchicine 0.6 MG tablet 2 tablets day one and then one tablet daily  22 tablet  0  . diltiazem (CARDIZEM CD) 120 MG 24 hr capsule Take 1 capsule (120 mg total) by mouth daily.  30 capsule  5  . furosemide (LASIX) 20 MG tablet Take 1 tablet (20 mg total) by mouth once. You may take one tablet (20 mg) once daily as needed for increased swelling  10 tablet  1  . metoprolol succinate (TOPROL-XL) 50 MG 24 hr tablet Take 1 tablet (50 mg total) by mouth 2 (two) times daily. Take with or immediately following a meal.  60 tablet  11   No current facility-administered medications for this visit.     Past Medical History  Diagnosis Date  . Atrial fibrillation     a. dx 08/2013 => Apixaban started 08/31/13    Past Surgical History  Procedure Laterality Date  . Hernia repair      History   Social History  . Marital Status: Divorced    Spouse Name: N/A    Number of Children: 1  . Years of Education: N/A   Occupational History  .      Computer graphics   Social History Main Topics  . Smoking status: Former Smoker  . Smokeless tobacco: Not on file  . Alcohol Use: Yes     Comment: occasionally  . Drug Use: No  . Sexual Activity: Not on file   Other Topics  Concern  . Not on file   Social History Narrative  . No narrative on file    ROS: no fevers or chills, productive cough, hemoptysis, dysphasia, odynophagia, melena, hematochezia, dysuria, hematuria, rash, seizure activity, orthopnea, PND, pedal edema, claudication. Remaining systems are negative.  Physical Exam: Well-developed well-nourished in no acute distress.  Skin is warm and dry.  HEENT is normal.  Neck is supple.  Chest is clear to auscultation with normal expansion.  Cardiovascular exam is irregular and tachycardic Abdominal exam nontender or distended. No masses palpated. Extremities show no edema. neuro grossly intact  ECG atrial fibrillation at a rate of 120. Nonspecific ST changes.     

## 2013-09-27 NOTE — Patient Instructions (Signed)

## 2013-09-27 NOTE — Assessment & Plan Note (Signed)
Echocardiogram shows ejection fraction 35-40%. 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 Hopefully reestablishing sinus rhythm will improve his LV function. He will need a repeat echocardiogram approximately 8 weeks following cardioversion. If LV function remains reduced in sinus rhythm he will need an ischemia evaluation and the addition of an ACE inhibitor. I have asked him to avoid alcohol in case this is contributing at all.

## 2013-09-27 NOTE — Assessment & Plan Note (Signed)
Patient remains in atrial fibrillation and his rate is elevated. However he has not taken his Toprol or Cardizem today. Continue present dose. 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 I therefore feel we should proceed with cardioversion. He has been on apixaban since December 26. He states he missed 1 AM dose 2 weeks ago. I will therefore schedule his procedure for 1 week from today. We will continue anticoagulation for 4 weeks following the procedure. If he has recurrent atrial fibrillation following cardioversion he will most likely need an antiarrhythmic. We will make a decision concerning long-term anticoagulation at followup office visit. His only embolic risk factor is congestive heart failure from his atrial fibrillation and newly diagnosed cardiomyopathy.

## 2013-10-05 ENCOUNTER — Encounter (HOSPITAL_COMMUNITY)
Admission: RE | Disposition: A | Discharge status: Home / Self Care | Payer: Managed Care, Other (non HMO) | Source: Ambulatory Visit | Attending: Internal Medicine

## 2013-10-05 ENCOUNTER — Encounter (HOSPITAL_COMMUNITY): Payer: Self-pay | Admitting: *Deleted

## 2013-10-05 ENCOUNTER — Ambulatory Visit (HOSPITAL_COMMUNITY)
Admission: RE | Admit: 2013-10-05 | Discharge: 2013-10-05 | Disposition: A | Discharge status: Home / Self Care | Payer: Managed Care, Other (non HMO) | Source: Ambulatory Visit | Attending: Internal Medicine | Admitting: Internal Medicine

## 2013-10-05 ENCOUNTER — Ambulatory Visit (HOSPITAL_COMMUNITY): Payer: Managed Care, Other (non HMO) | Admitting: Anesthesiology

## 2013-10-05 ENCOUNTER — Encounter (HOSPITAL_COMMUNITY): Payer: Managed Care, Other (non HMO) | Admitting: Anesthesiology

## 2013-10-05 DIAGNOSIS — I517 Cardiomegaly: Secondary | ICD-10-CM | POA: Insufficient documentation

## 2013-10-05 DIAGNOSIS — I77819 Aortic ectasia, unspecified site: Secondary | ICD-10-CM | POA: Insufficient documentation

## 2013-10-05 DIAGNOSIS — Z87891 Personal history of nicotine dependence: Secondary | ICD-10-CM | POA: Insufficient documentation

## 2013-10-05 DIAGNOSIS — Z7901 Long term (current) use of anticoagulants: Secondary | ICD-10-CM | POA: Insufficient documentation

## 2013-10-05 DIAGNOSIS — I4891 Unspecified atrial fibrillation: Secondary | ICD-10-CM | POA: Insufficient documentation

## 2013-10-05 HISTORY — PX: CARDIOVERSION: SHX1299

## 2013-10-05 SURGERY — CARDIOVERSION
Anesthesia: Monitor Anesthesia Care

## 2013-10-05 MED ORDER — PROPOFOL 10 MG/ML IV BOLUS
INTRAVENOUS | Status: DC | PRN
  Administered 2013-10-05: 60 mg via INTRAVENOUS

## 2013-10-05 NOTE — Anesthesia Preprocedure Evaluation (Addendum)
Anesthesia Evaluation  Patient identified by MRN, date of birth, ID band Patient awake    Reviewed: Allergy & Precautions, H&P , NPO status , Patient's Chart, lab work & pertinent test results, reviewed documented beta blocker date and time   Airway Mallampati: II  Neck ROM: full    Dental   Pulmonary former smoker,          Cardiovascular + dysrhythmias Atrial Fibrillation     Neuro/Psych    GI/Hepatic   Endo/Other  obese  Renal/GU      Musculoskeletal   Abdominal   Peds  Hematology   Anesthesia Other Findings   Reproductive/Obstetrics                          Anesthesia Physical Anesthesia Plan  ASA: II  Anesthesia Plan: General   Post-op Pain Management:    Induction: Intravenous  Airway Management Planned: Mask  Additional Equipment:   Intra-op Plan:   Post-operative Plan:   Informed Consent: I have reviewed the patients History and Physical, chart, labs and discussed the procedure including the risks, benefits and alternatives for the proposed anesthesia with the patient or authorized representative who has indicated his/her understanding and acceptance.   Dental advisory given  Plan Discussed with: Anesthesiologist and CRNA  Anesthesia Plan Comments:         Anesthesia Quick Evaluation

## 2013-10-05 NOTE — Op Note (Signed)
Patient anesthetized with 60 mg propofol  With pads in the AP position patient cardioverted with 200 J synchronized biphasic energy to SR  Note that he had a burst of atrial tach/SVT soon after then reverted to SR.  12  Lead EKG pending.

## 2013-10-05 NOTE — Discharge Instructions (Signed)
Electrical Cardioversion Electrical cardioversion is the delivery of a jolt of electricity to change the rhythm of the heart. Sticky patches or metal paddles are placed on the chest to deliver the electricity from a device. This is done to restore a normal rhythm. A rhythm that is too fast or not regular keeps the heart from pumping well. Electrical cardioversion is done in an emergency if:   There is low or no blood pressure as a result of the heart rhythm.   Normal rhythm must be restored as fast as possible to protect the brain and heart from further damage.   It may save a life. Cardioversion may be Electrical Cardioversion, Care After Refer to this sheet in the next few weeks. These instructions provide you with information on caring for yourself after your procedure. Your health care provider may also give you more specific instructions. Your treatment has been planned according to current medical practices, but problems sometimes occur. Call your health care provider if you have any problems or questions after your procedure. WHAT TO EXPECT AFTER THE PROCEDURE After your procedure, it is typical to have the following sensations: Some redness on the skin where the shocks were delivered. If this is tender, a sunburn lotion or hydrocortisone cream may help. Possible return of an abnormal heart rhythm within hours or days after the procedure. HOME CARE INSTRUCTIONS Only take medicine as directed by your health care provider. Be sure you understand how and when to take your medicine. Learn how to feel your pulse and check it often. Limit your activity for 48 hours after the procedure or as directed. Avoid or minimize caffeine and other stimulants as directed. SEEK MEDICAL CARE IF: You feel like your heart is beating too fast or your pulse is not regular. You have any questions about your medicines. You have bleeding that will not stop. SEEK IMMEDIATE MEDICAL CARE IF: You are dizzy or  feel faint. It is hard to breathe or you feel short of breath. There is a change in discomfort in your chest. Your speech is slurred or you have trouble moving an arm or leg on one side of your body. You get a serious muscle cramp that does not go away. Your fingers or toes turn cold or blue. MAKE SURE YOU:  Understand these instructions.  Will watch your condition.  Will get help right away if you are not doing well or get worse. Document Released: 06/13/2013 Document Reviewed: 03/07/2013 [facility] Patient Information 2014 [place]. done for heart rhythms that are not immediately life-threatening, such as atrial fibrillation or flutter, in which:   The heart is beating too fast or is not regular.   Medicine to change the rhythm has not worked.   It is safe to wait in order to allow time for preparation.  Symptoms of the abnormal rhythm are bothersome.  The risk of stroke and other serious complications can be reduced. LET YOUR CAREGIVER KNOW ABOUT:   All medicines you are taking, including vitamins, herbs, eye drops, creams, and over-the-counter medicines.   Previous problems you or members of your family have had with the use of anesthetics.   Any blood disorders you have.   Previous surgeries you have had.   Medical conditions you have. RISKS AND COMPLICATIONS  Generally, this is a safe procedure. However, as with any procedure, complications can occur. Possible complications include:   Breathing problems related to the anesthetic used.  Cardiac arrest This risk is rare.  A blood  clot that breaks free and travels to other parts of your body. This could cause a stroke or other problems. The risk of this is lowered by use of blood thinning medicine (anticoagulant) prior to the procedure. BEFORE THE PROCEDURE   You may have tests to detect blood clots in your heart and evaluate heart function.  You may start taking anticoagulants so your blood does not  clot as easily.   Medicines may be given to help stabilize your heart rate and rhythm. PROCEDURE  You will be given medicine through an IV tube to reduce discomfort and make you sleepy (sedative).   An electrical shock will be delivered. AFTER THE PROCEDURE Your heart rhythm will be watched to make sure it does not change.You may be able to go home within a few hours.  Document Released: 08/13/2002 Document Revised: 06/13/2013 Document Reviewed: 03/07/2013 [facility] Patient Information 2014 [place].

## 2013-10-05 NOTE — H&P (View-Only) (Signed)
      HPI: FU atrial fibrillation. Initially seen in December of 2014 for atrial fibrillation. TSH was normal. Echocardiogram in January of 2015 showed an ejection fraction of 35-40%. The aortic root was mildly dilated. There was moderate left atrial enlargement and mild to moderate right atrial enlargement. Patient was started on apixaban on December 26. He has also been treated with Toprol and Cardizem. At present he has occasional orthopnea but no PND, pedal edema, palpitations, syncope or chest pain. He has not taken his Toprol or Cardizem today.   Current Outpatient Prescriptions  Medication Sig Dispense Refill  . apixaban (ELIQUIS) 5 MG TABS tablet Take 1 tablet (5 mg total) by mouth 2 (two) times daily.  60 tablet  6  . colchicine 0.6 MG tablet 2 tablets day one and then one tablet daily  22 tablet  0  . diltiazem (CARDIZEM CD) 120 MG 24 hr capsule Take 1 capsule (120 mg total) by mouth daily.  30 capsule  5  . furosemide (LASIX) 20 MG tablet Take 1 tablet (20 mg total) by mouth once. You may take one tablet (20 mg) once daily as needed for increased swelling  10 tablet  1  . metoprolol succinate (TOPROL-XL) 50 MG 24 hr tablet Take 1 tablet (50 mg total) by mouth 2 (two) times daily. Take with or immediately following a meal.  60 tablet  11   No current facility-administered medications for this visit.     Past Medical History  Diagnosis Date  . Atrial fibrillation     a. dx 08/2013 => Apixaban started 08/31/13    Past Surgical History  Procedure Laterality Date  . Hernia repair      History   Social History  . Marital Status: Divorced    Spouse Name: N/A    Number of Children: 1  . Years of Education: N/A   Occupational History  .      Programmer, applications   Social History Main Topics  . Smoking status: Former Research scientist (life sciences)  . Smokeless tobacco: Not on file  . Alcohol Use: Yes     Comment: occasionally  . Drug Use: No  . Sexual Activity: Not on file   Other Topics  Concern  . Not on file   Social History Narrative  . No narrative on file    ROS: no fevers or chills, productive cough, hemoptysis, dysphasia, odynophagia, melena, hematochezia, dysuria, hematuria, rash, seizure activity, orthopnea, PND, pedal edema, claudication. Remaining systems are negative.  Physical Exam: Well-developed well-nourished in no acute distress.  Skin is warm and dry.  HEENT is normal.  Neck is supple.  Chest is clear to auscultation with normal expansion.  Cardiovascular exam is irregular and tachycardic Abdominal exam nontender or distended. No masses palpated. Extremities show no edema. neuro grossly intact  ECG atrial fibrillation at a rate of 120. Nonspecific ST changes.

## 2013-10-05 NOTE — Anesthesia Postprocedure Evaluation (Signed)
Anesthesia Post Note  Patient: Larry Evans  Procedure(s) Performed: Procedure(s) (LRB): CARDIOVERSION (N/A)  Anesthesia type: MAC  Patient location: PACU  Post pain: Pain level controlled and Adequate analgesia  Post assessment: Post-op Vital signs reviewed, Patient's Cardiovascular Status Stable and Respiratory Function Stable  Last Vitals:  Filed Vitals:   10/05/13 1320  BP: 122/92  Pulse: 61  Temp:   Resp: 16    Post vital signs: Reviewed and stable  Level of consciousness: awake, alert  and oriented  Complications: No apparent anesthesia complications

## 2013-10-05 NOTE — Interval H&P Note (Signed)
History and Physical Interval Note:  10/05/2013 8:25 AM  Larry Evans  has presented today for surgery, with the diagnosis of AFIB  The various methods of treatment have been discussed with the patient and family. After consideration of risks, benefits and other options for treatment, the patient has consented to  Procedure(s): CARDIOVERSION (N/A) as a surgical intervention .  The patient's history has been reviewed, patient examined, no change in status, stable for surgery.  I have reviewed the patient's chart and labs.  Questions were answered to the patient's satisfaction.     Dorris Carnes

## 2013-10-05 NOTE — Transfer of Care (Signed)
Immediate Anesthesia Transfer of Care Note  Patient: EERO DINI  Procedure(s) Performed: Procedure(s) with comments: CARDIOVERSION (N/A) - 12:35       elective cardioversion, Propofol  60  mg, IV adminiter by Dr. Gwenlyn Fudge, anesthesia...synch 200 joules by Dr. Harrington Challenger.....converted to NSR....  Patient Location: Endoscopy Unit  Anesthesia Type:General  Level of Consciousness: awake, alert  and oriented  Airway & Oxygen Therapy: Patient Spontanous Breathing  Post-op Assessment: Report given to PACU RN and Post -op Vital signs reviewed and stable  Post vital signs: Reviewed and stable  Complications: No apparent anesthesia complications

## 2013-10-05 NOTE — Preoperative (Signed)
Beta Blockers   Reason not to administer Beta Blockers:Not Applicable 

## 2013-10-08 ENCOUNTER — Encounter (HOSPITAL_COMMUNITY): Payer: Self-pay | Admitting: Internal Medicine

## 2013-10-09 ENCOUNTER — Ambulatory Visit (INDEPENDENT_AMBULATORY_CARE_PROVIDER_SITE_OTHER): Payer: Managed Care, Other (non HMO) | Admitting: Pulmonary Disease

## 2013-10-09 ENCOUNTER — Encounter: Payer: Self-pay | Admitting: Pulmonary Disease

## 2013-10-09 ENCOUNTER — Encounter (INDEPENDENT_AMBULATORY_CARE_PROVIDER_SITE_OTHER): Payer: Self-pay

## 2013-10-09 VITALS — BP 134/70 | HR 60 | Temp 98.3°F | Ht 72.0 in | Wt 248.4 lb

## 2013-10-09 DIAGNOSIS — G4733 Obstructive sleep apnea (adult) (pediatric): Secondary | ICD-10-CM

## 2013-10-09 NOTE — Assessment & Plan Note (Signed)
The patient's history is very suggestive of clinically significant obstructive sleep apnea. He also has significant underlying cardiovascular disease that can be greatly impacted by sleep disordered breathing. It had a long discussion with him about the pathophysiology of sleep apnea, including its impact to his cardiovascular health and quality of life. The patient will need to have a sleep study for diagnosis, and I think he is a reasonable candidate for home sleep testing.

## 2013-10-09 NOTE — Patient Instructions (Signed)
Will schedule for home sleep testing.  Will arrange followup once the results are available.   

## 2013-10-09 NOTE — Progress Notes (Signed)
Subjective:    Patient ID: Larry Evans, male    DOB: Dec 07, 1965, 48 y.o.   MRN: [id number]  HPI The patient is a 48 year old male who I've been asked to see for possible obstructive sleep apnea. He is been diagnosed with a cardiomyopathy as well as atrial fibrillation, and the question has been raised whether he may have sleep disordered breathing. His wife has noted. Loud snoring, and she has moved to the sofa to sleep. She has seen an abnormal breathing pattern during sleep, and the patient has noted choking arousals. He has frequent awakenings at night, and is not rested in the mornings upon arising. He notes significant sleep pressure during the day with inactivity, and will fall asleep easily in front of his computer while at work. He also falls asleep easily while watching television in the evening. He denies any sleepiness with driving. The patient states that his weight is up 15 pounds over the last 2 years, and his Epworth score today is 11.     Sleep Questionnaire What time do you typically go to bed?( Between what hours) [id number] [phone] at 1407 on 10/09/13 by Virl Cagey, CMA How long does it take you to fall asleep? 5 mins 5 mins at 1407 on 10/09/13 by Virl Cagey, CMA How many times during the night do you wake up? 4 4 at 1407 on 10/09/13 by Virl Cagey, CMA What time do you get out of bed to start your day? 0600 0600 at 1407 on 10/09/13 by Virl Cagey, CMA Do you drive or operate heavy machinery in your occupation? No No at 1407 on 10/09/13 by Virl Cagey, CMA How much has your weight changed (up or down) over the past two years? (In pounds) 15 lb (6.804 kg) 15 lb (6.804 kg) at 1407 on 10/09/13 by Virl Cagey, CMA Have you ever had a sleep study before? No No at 1407 on 10/09/13 by Virl Cagey, CMA Do you currently use CPAP? No No at 1407 on 10/09/13 by Virl Cagey, CMA Do you wear oxygen at any time? No No at 1407 on 10/09/13 by  Virl Cagey, CMA O2 Flow Rate (L/min)    Review of Systems  Constitutional: Negative for fever and unexpected weight change.  HENT: Negative for congestion, dental problem, ear pain, nosebleeds, postnasal drip, rhinorrhea, sinus pressure, sneezing, sore throat and trouble swallowing.   Eyes: Negative for redness and itching.  Respiratory: Positive for shortness of breath. Negative for cough, chest tightness and wheezing.   Cardiovascular: Positive for palpitations ( irregular heartbeats). Negative for leg swelling.  Gastrointestinal: Negative for nausea and vomiting.  Genitourinary: Negative for dysuria.  Musculoskeletal: Negative for joint swelling.  Skin: Negative for rash.  Neurological: Negative for headaches.  Hematological: Does not bruise/bleed easily.  Psychiatric/Behavioral: Negative for dysphoric mood. The patient is not nervous/anxious.        Objective:   Physical Exam Constitutional:  Well developed, no acute distress  HENT:  Nares patent without discharge  Oropharynx without exudate, palate and uvula are moderately elongated.   Eyes:  Perrla, eomi, no scleral icterus  Neck:  No JVD, no TMG  Cardiovascular:  Normal rate,?regular rhythm, no rubs or gallops.  No murmurs        Intact distal pulses  Pulmonary :  Normal breath sounds, no stridor or respiratory distress   No rales, rhonchi, or wheezing  Abdominal:  Soft, nondistended, bowel sounds present.  No  tenderness noted.   Musculoskeletal: mild lower extremity edema noted.  Lymph Nodes:  No cervical lymphadenopathy noted  Skin:  No cyanosis noted  Neurologic:  Alert, appropriate, moves all 4 extremities without obvious deficit.         Assessment & Plan:

## 2013-10-10 ENCOUNTER — Other Ambulatory Visit: Payer: Self-pay | Admitting: *Deleted

## 2013-10-17 ENCOUNTER — Telehealth: Payer: Self-pay | Admitting: *Deleted

## 2013-10-17 NOTE — Telephone Encounter (Signed)
Patient wants to know when he needs to follow up post cardioversion. Also questioning if he is to remain on all of the meds that he is taking currently. Please advise. Thanks, MI

## 2013-10-17 NOTE — Telephone Encounter (Signed)
Spoke with pt, he is having occ fluttering in his chest. He is unable to tell when he is in atrial fib. He reports feeling okay but thinks his heart rate maybe elevated at times. He would like an EKG to see if he is back in atrial fib. appt made in the nurse room for pt to have EKG.

## 2013-10-18 ENCOUNTER — Telehealth: Payer: Self-pay | Admitting: Cardiology

## 2013-10-18 NOTE — Telephone Encounter (Signed)
Spoke with pt, he went and bought a pulse ox and his heart rate is running in the 70's. He cx his appt for EKG based on the heart rate. Follow up scheduled with dr Stanford Breed

## 2013-10-18 NOTE — Telephone Encounter (Signed)
New Prob   Pt has some questions regarding following up with Dr. Stanford Breed. Please call.

## 2013-10-31 ENCOUNTER — Telehealth: Payer: Self-pay | Admitting: Pulmonary Disease

## 2013-10-31 NOTE — Telephone Encounter (Signed)
Pt aware he should be next on list for home sleep test Larry Evans

## 2013-10-31 NOTE — Telephone Encounter (Signed)
Order is in epic for home sleep test. Please advise PCC's thanks

## 2013-11-02 DIAGNOSIS — G4733 Obstructive sleep apnea (adult) (pediatric): Secondary | ICD-10-CM

## 2013-11-05 ENCOUNTER — Telehealth: Payer: Self-pay | Admitting: Cardiology

## 2013-11-05 NOTE — Telephone Encounter (Signed)
New message      Pt want to talk to Dr Stanford Breed regarding a rescheduling of an appt.

## 2013-11-05 NOTE — Telephone Encounter (Signed)
Left message for pt to call.

## 2013-11-07 NOTE — Telephone Encounter (Signed)
Spoke with pt, he feels like he may be back in atrial fib after DCCV. His appt was moved out to the end of April and he would like to be seen sooner. He is having fluttering in his chest and is tired. This is the same way he felt prior to the DCCV. Pt will see dr Stanford Breed tomorrow.

## 2013-11-08 ENCOUNTER — Encounter: Payer: Self-pay | Admitting: Cardiology

## 2013-11-08 ENCOUNTER — Telehealth: Payer: Self-pay | Admitting: Pulmonary Disease

## 2013-11-08 ENCOUNTER — Ambulatory Visit (INDEPENDENT_AMBULATORY_CARE_PROVIDER_SITE_OTHER): Payer: Managed Care, Other (non HMO) | Admitting: Cardiology

## 2013-11-08 ENCOUNTER — Encounter: Payer: Self-pay | Admitting: Pulmonary Disease

## 2013-11-08 VITALS — BP 100/82 | HR 111 | Ht 72.0 in | Wt 242.0 lb

## 2013-11-08 DIAGNOSIS — I428 Other cardiomyopathies: Secondary | ICD-10-CM

## 2013-11-08 DIAGNOSIS — I4891 Unspecified atrial fibrillation: Secondary | ICD-10-CM

## 2013-11-08 DIAGNOSIS — G4733 Obstructive sleep apnea (adult) (pediatric): Secondary | ICD-10-CM

## 2013-11-08 DIAGNOSIS — I420 Dilated cardiomyopathy: Secondary | ICD-10-CM

## 2013-11-08 MED ORDER — METOPROLOL SUCCINATE ER 50 MG PO TB24: ORAL_TABLET | ORAL | Status: DC

## 2013-11-08 NOTE — Progress Notes (Signed)
      HPI: FU atrial fibrillation. Initially seen in December of 2014 for atrial fibrillation. TSH was normal. Echocardiogram in January of 2015 showed an ejection fraction of 35-40%. The aortic root was mildly dilated. There was moderate left atrial enlargement and mild to moderate right atrial enlargement. Patient was started on apixaban on December 26. He has also been treated with Toprol and Cardizem. Patient had DCCV 10/05/13. Since then, he notes some dyspnea on exertion, fatigue but no chest pain. Occasional palpitations. No syncope.   Current Outpatient Prescriptions  Medication Sig Dispense Refill  . apixaban (ELIQUIS) 5 MG TABS tablet Take 1 tablet (5 mg total) by mouth 2 (two) times daily.  60 tablet  6  . diltiazem (CARDIZEM CD) 120 MG 24 hr capsule Take 1 capsule (120 mg total) by mouth daily.  30 capsule  5  . furosemide (LASIX) 20 MG tablet       . metoprolol succinate (TOPROL-XL) 50 MG 24 hr tablet Take 1 tablet (50 mg total) by mouth 2 (two) times daily. Take with or immediately following a meal.  60 tablet  11   No current facility-administered medications for this visit.     Past Medical History  Diagnosis Date  . Atrial fibrillation     a. dx 08/2013 => Apixaban started 08/31/13    Past Surgical History  Procedure Laterality Date  . Hernia repair    . Cardioversion N/A 10/05/2013    Procedure: CARDIOVERSION;  Surgeon: Fay Records, MD;  Location[facility] ENDOSCOPY;  Service: Cardiovascular;  Laterality: N/A;  12:35       elective cardioversion, Propofol  60  mg, IV adminiter by Dr. Gwenlyn Fudge, anesthesia...synch 200 joules by Dr. Harrington Challenger.....converted to NSR....    History   Social History  . Marital Status: Divorced    Spouse Name: N/A    Number of Children: 1  . Years of Education: N/A   Occupational History  .      Programmer, applications   Social History Main Topics  . Smoking status: Former Smoker -- 0.25 packs/day for 5 years    Types: Cigarettes    Quit date:  10/05/2012  . Smokeless tobacco: Not on file  . Alcohol Use: 1.2 oz/week    2 Cans of beer per week     Comment: occasionally--2-3 drinks per day  . Drug Use: No  . Sexual Activity: Not on file   Other Topics Concern  . Not on file   Social History Narrative  . No narrative on file    ROS: no fevers or chills, productive cough, hemoptysis, dysphasia, odynophagia, melena, hematochezia, dysuria, hematuria, rash, seizure activity, orthopnea, PND, pedal edema, claudication. Remaining systems are negative.  Physical Exam: Well-developed well-nourished in no acute distress.  Skin is warm and dry.  HEENT is normal.  Neck is supple.  Chest is clear to auscultation with normal expansion.  Cardiovascular exam is irregular and tachycardic Abdominal exam nontender or distended. No masses palpated. Extremities show no edema. neuro grossly intact  ECG atrial fibrillation at a rate of 111. No ST changes.

## 2013-11-08 NOTE — Telephone Encounter (Signed)
Needs ov to discuss sleep study results.

## 2013-11-08 NOTE — Assessment & Plan Note (Addendum)
Patient has converted back to atrial fibrillation. He is symptomatic with increased dyspnea on exertion and fatigue. Continue Cardizem. Continue metoprolol but increase to 75 mg by mouth twice a day for better rate control. Continue apixaban (CHADSvasc 1). I will ask Dr. Rayann Heman to review for consideration of ablation versus initiation of antiarrhythmic. He would most likely need Tikosyn. Once we can reestablish sinus rhythm I do not think he will need long-term anticoagulation given low CHADSvasc score. I have asked him to DC all ETOH.

## 2013-11-08 NOTE — Assessment & Plan Note (Signed)
Most likely tachycardia mediated. Continue Cardizem and metoprolol for rate control. I cannot add an ACE inhibitor at this point given borderline blood pressure. Plan to repeat echocardiogram after sinus rhythm established. He would need an ischemia evaluation if LV function does not improve. DC all ETOH.

## 2013-11-08 NOTE — Patient Instructions (Signed)
Your physician recommends that you schedule a follow-up appointment in: 3 MONTHS WITH DR CRENSHAW  INCREASE METOPROLOL SUCC 75 MG = ONE AND ONE HALF OF 50 MG TABLET TWICE DAILY  REFERRAL TO DR Rayann Heman TO DISCUSS ATRIAL FIB ABLATION

## 2013-11-08 NOTE — Assessment & Plan Note (Signed)
Managed by pulmonary

## 2013-11-12 NOTE — Telephone Encounter (Signed)
LMOM x 1 

## 2013-11-13 NOTE — Telephone Encounter (Signed)
Pt scheduled to f/u with [facility] 11/14/13 at 945 for sleep study results. Nothing further needed.

## 2013-11-14 ENCOUNTER — Encounter: Payer: Self-pay | Admitting: Pulmonary Disease

## 2013-11-14 ENCOUNTER — Ambulatory Visit (INDEPENDENT_AMBULATORY_CARE_PROVIDER_SITE_OTHER): Payer: Managed Care, Other (non HMO) | Admitting: Pulmonary Disease

## 2013-11-14 VITALS — BP 120/64 | HR 87 | Temp 97.4°F | Ht 72.0 in | Wt 240.2 lb

## 2013-11-14 DIAGNOSIS — G4733 Obstructive sleep apnea (adult) (pediatric): Secondary | ICD-10-CM

## 2013-11-14 NOTE — Progress Notes (Signed)
   Subjective:    Patient ID: Larry Evans, male    DOB: [birth year], 48 y.o.   MRN: [id number]  HPI The patient comes in today for followup of his recent home sleep test. He was found to have severe OSA, with an AHI of 63 events per hour and oxygen desaturation as low as 80%. I have reviewed the study with him in detail, and answered all of his questions.   Review of Systems  Constitutional: Negative for fever and unexpected weight change.  HENT: Negative for congestion, dental problem, ear pain, nosebleeds, postnasal drip, rhinorrhea, sinus pressure, sneezing, sore throat and trouble swallowing.   Eyes: Negative for redness and itching.  Respiratory: Positive for shortness of breath ( related to AFib). Negative for cough, chest tightness and wheezing.   Cardiovascular: Positive for palpitations ( pt in A-Fib). Negative for leg swelling.  Gastrointestinal: Negative for nausea and vomiting.  Genitourinary: Negative for dysuria.  Musculoskeletal: Negative for joint swelling.  Skin: Negative for rash.  Neurological: Negative for headaches.  Hematological: Does not bruise/bleed easily.  Psychiatric/Behavioral: Negative for dysphoric mood. The patient is not nervous/anxious.        Objective:   Physical Exam Overweight male in no acute distress Nose without purulence or discharge noted Neck without lymphadenopathy or thyromegaly Lower extremities with no significant edema, no cyanosis Alert, does not appear to be overly sleepy, moves all 4 extremities.       Assessment & Plan:

## 2013-11-14 NOTE — Patient Instructions (Signed)
Will start on cpap at a moderate pressure level.  Please call if having issues with tolerance Work on weight loss followup with me 8 weeks.

## 2013-11-14 NOTE — Assessment & Plan Note (Signed)
The patient has been found to have severe obstructive sleep apnea by his recent home sleep test, and I have stressed to him how this can impact to his cardiovascular health. I have recommended a trial of CPAP while he is working on aggressive weight loss, and the patient is agreeable. I will set the patient up on cpap at a moderate pressure level to allow for desensitization, and will troubleshoot the device over the next 4-6weeks if needed.  The pt is to call me if having issues with tolerance.  Will then optimize the pressure once patient is able to wear cpap on a consistent basis.

## 2013-11-15 ENCOUNTER — Telehealth: Payer: Self-pay | Admitting: Cardiology

## 2013-11-15 ENCOUNTER — Telehealth: Payer: Self-pay | Admitting: *Deleted

## 2013-11-15 NOTE — Telephone Encounter (Signed)
New Prob    Pt has some questions regarding Eliquis and a prescription card he received from Eliquis. Please call.

## 2013-11-15 NOTE — Telephone Encounter (Signed)
PA over the phone to [facility] for patients ELIQUIS, it will be a tier 3, patient informed.

## 2013-11-15 NOTE — Telephone Encounter (Signed)
Pt calls b/c his insurance will not cover Eliquis. He has called the company and states he was told he qualifies for the $10.00 copay card & were mailing it to him.  States his pharmacy sent [place] a notification about ?calling his insurance company.  spoke with Lovett Sox & she will do prior authorization . He took his last Eliquis this am. Will check on samples for pt.    LMOVMSamples ready.  Also included $10. 00 copay card  Horton Chin RN

## 2013-11-30 ENCOUNTER — Ambulatory Visit: Payer: Managed Care, Other (non HMO) | Admitting: Cardiology

## 2013-12-19 ENCOUNTER — Encounter: Payer: Self-pay | Admitting: Internal Medicine

## 2013-12-19 ENCOUNTER — Ambulatory Visit (INDEPENDENT_AMBULATORY_CARE_PROVIDER_SITE_OTHER): Payer: Managed Care, Other (non HMO) | Admitting: Internal Medicine

## 2013-12-19 ENCOUNTER — Telehealth: Payer: Self-pay | Admitting: Pharmacist

## 2013-12-19 VITALS — BP 129/89 | HR 93 | Ht 72.0 in | Wt 250.0 lb

## 2013-12-19 DIAGNOSIS — I420 Dilated cardiomyopathy: Secondary | ICD-10-CM

## 2013-12-19 DIAGNOSIS — G4733 Obstructive sleep apnea (adult) (pediatric): Secondary | ICD-10-CM

## 2013-12-19 DIAGNOSIS — I428 Other cardiomyopathies: Secondary | ICD-10-CM

## 2013-12-19 DIAGNOSIS — I4891 Unspecified atrial fibrillation: Secondary | ICD-10-CM

## 2013-12-19 NOTE — Progress Notes (Signed)
Primary Care Physician: Milagros Evener, MD Referring Physician:  Dr Levy Pupa Larry Evans is a 48 y.o. male with a h/o persistent atrial fibrillation, ETOh abuse, and sleep apnea (compliant with CPAP) who presents for EP consultation.  He reports initially being diagnosed with atrial fibrillation 12/14, though in retrospect, he has had palpitations "for years".  He reports symptoms of fatigue and decreased exercise tolerance with his afib.  Echo 1/15 revealed reduced EF with biatrial enlargement.  He was initiated on eliquis as well as rate control.  He underwent cardioversion 10/05/13.  He reports significant improvement in exercise tolerance and fatigue but has unfortunately returned to afib.  He has a h/o heavy ETOH.  He is trying to "cut back".  He has also been diagnosed with OSA and uses his CPAP. Today, he denies symptoms of palpitations, chest pain, shortness of breath, orthopnea, PND, lower extremity edema, dizziness, presyncope, syncope, or neurologic sequela. The patient is tolerating medications without difficulties and is otherwise without complaint today.   Past Medical History  Diagnosis Date  . Persistent atrial fibrillation     a. dx 08/2013 => Apixaban started 08/31/13  . Obstructive sleep apnea     uses CPAP   Past Surgical History  Procedure Laterality Date  . Hernia repair    . Cardioversion N/A 10/05/2013    Procedure: CARDIOVERSION;  Surgeon: Fay Records, MD;  Location: [facility] ENDOSCOPY;  Service: Cardiovascular;  Laterality: N/A;  12:35       elective cardioversion, Propofol  60  mg, IV adminiter by Dr. Gwenlyn Fudge, anesthesia...synch 200 joules by Dr. Harrington Challenger.....converted to NSR....    Current Outpatient Prescriptions  Medication Sig Dispense Refill  . apixaban (ELIQUIS) 5 MG TABS tablet Take 1 tablet (5 mg total) by mouth 2 (two) times daily.  60 tablet  6  . diltiazem (CARDIZEM CD) 120 MG 24 hr capsule Take 1 capsule (120 mg total) by mouth daily.  30 capsule  5    . furosemide (LASIX) 20 MG tablet Take 20 mg by mouth as needed.       . metoprolol succinate (TOPROL-XL) 50 MG 24 hr tablet Take one and one half tablet twice daily Take with or immediately following a meal.  90 tablet  11   No current facility-administered medications for this visit.    No Known Allergies  History   Social History  . Marital Status: Divorced    Spouse Name: N/A    Number of Children: 1  . Years of Education: N/A   Occupational History  .      Programmer, applications   Social History Main Topics  . Smoking status: Former Smoker -- 0.25 packs/day for 5 years    Types: Cigarettes    Quit date: 10/05/2012  . Smokeless tobacco: Not on file  . Alcohol Use: 1.2 oz/week    2 Cans of beer per week     Comment: heavy ETOH  . Drug Use: No  . Sexual Activity: Not on file   Other Topics Concern  . Not on file   Social History Narrative  . No narrative on file    Family History  Problem Relation Age of Onset  . Heart disease      No family history  . Lung cancer Maternal Grandmother   . Throat cancer Maternal Grandfather     ROS- All systems are reviewed and negative except as per the HPI above  Physical Exam: Filed Vitals:   12/19/13 2671  BP: 129/89  Pulse: 93  Height: 6' (1.829 m)  Weight: 250 lb (113.399 kg)    GEN- The patient is well appearing, alert and oriented x 3 today.   Head- normocephalic, atraumatic Eyes-  Sclera clear, conjunctiva pink Ears- hearing intact Oropharynx- clear Neck- supple, no JVP Lymph- no cervical lymphadenopathy Lungs- Clear to ausculation bilaterally, normal work of breathing Heart- irregular rate and rhythm, no murmurs, rubs or gallops, PMI not laterally displaced GI- soft, NT, ND, + BS Extremities- no clubbing, cyanosis, or edema MS- no significant deformity or atrophy Skin- no rash or lesion Psych- euthymic mood, full affect Neuro- strength and sensation are intact  EKG today reveals afib, V rate 93 bpm, QTc  432  Assessment and Plan:  1. Persistent afib The patient has persistent symptomatic atrial fibrillation.  He has failed cardioversion but has not tried AAD therapy.  He is anticoagulated with eliquis.  He is reasonably rate controlled presently but does have a reduced EF, likely due to afib.  I suspect that ETOH and OSA are the cause for his afib.  Lifestyle modification is encouraged.  I am concerned that he has developed significant atriopathy/ atrial enlargement and that our ability to maintain sinus rhythm long term is reduced.  Therapeutic strategies for afib including medicine and ablation were discussed in detail with the patient today.  At this time, per guidelines, I would recommend AAD therapy.  I think that given his depressed EF that tikosyn is the best option.  We will therefore arrange for tikosyn initiation in the near future.  The importance of compliance with eliquis in the interim was discussed today.  If he fails medical therapy with tikosyn then we will consider ablation at that time.    2. OSA Compliance with cpap encouraged  3. ETOH Cessation of ETOH is advised  4. Nonischemic CM Continue medical therapy as per Dr Stanford Breed  He will follow-up with Dr Stanford Breed post Phyllis Ginger initiation.  I am happy to see again as needed should his afib recur.

## 2013-12-19 NOTE — Patient Instructions (Signed)
Your physician has recommended you start Tikosyn. This requires a 3 day hospitalization. Our pharmacist Ysidro Evert) will be in contact with you to arrange this.  Follow up with Dr. Stanford Breed as scheduled.  Follow up as needed with Dr. Rayann Heman.

## 2013-12-19 NOTE — Telephone Encounter (Signed)
Message copied by Bishop Limbo on Wed Dec 19, 2013 10:43 AM ------      Message from: Larry Evans      Created: Wed Dec 19, 2013  9:19 AM      Regarding: Tikosyn loading       Please arrange Tikosyn loading on this patient.                  Thx      :) ------

## 2013-12-19 NOTE — Telephone Encounter (Signed)
Discussed with patient and he is set up for tikosyn load 12/26/13.  He will see me 9:00 am that morning for education, labs, and EKG.

## 2013-12-24 ENCOUNTER — Telehealth: Payer: Self-pay | Admitting: Internal Medicine

## 2013-12-24 NOTE — Telephone Encounter (Signed)
F/u ° ° °Pt returning your call °

## 2013-12-24 NOTE — Telephone Encounter (Signed)
New message     Talk to a nurse about being put on tikosyn

## 2013-12-24 NOTE — Telephone Encounter (Signed)
Patient reports having concerns about taking tikosyn after reading about it online. While he was waiting for call back from nurse he spoke with Ysidro Evert, PhD.   His questions/concerns were all addressed.

## 2013-12-24 NOTE — Telephone Encounter (Signed)
lmtcb

## 2013-12-26 ENCOUNTER — Ambulatory Visit (INDEPENDENT_AMBULATORY_CARE_PROVIDER_SITE_OTHER): Payer: Managed Care, Other (non HMO) | Admitting: Pharmacist

## 2013-12-26 ENCOUNTER — Other Ambulatory Visit: Payer: Self-pay | Admitting: Pharmacist

## 2013-12-26 ENCOUNTER — Inpatient Hospital Stay (HOSPITAL_COMMUNITY)
Admission: AD | Admit: 2013-12-26 | Discharge: 2013-12-31 | DRG: 309 | Disposition: A | Discharge status: Home / Self Care | Payer: Managed Care, Other (non HMO) | Source: Ambulatory Visit | Attending: Internal Medicine | Admitting: Internal Medicine

## 2013-12-26 ENCOUNTER — Encounter (HOSPITAL_COMMUNITY): Payer: Self-pay

## 2013-12-26 VITALS — BP 124/78 | HR 86 | Ht 72.0 in | Wt 250.0 lb

## 2013-12-26 DIAGNOSIS — I5022 Chronic systolic (congestive) heart failure: Secondary | ICD-10-CM | POA: Diagnosis present

## 2013-12-26 DIAGNOSIS — I498 Other specified cardiac arrhythmias: Secondary | ICD-10-CM | POA: Diagnosis not present

## 2013-12-26 DIAGNOSIS — I428 Other cardiomyopathies: Secondary | ICD-10-CM | POA: Diagnosis present

## 2013-12-26 DIAGNOSIS — I4891 Unspecified atrial fibrillation: Principal | ICD-10-CM

## 2013-12-26 DIAGNOSIS — G4733 Obstructive sleep apnea (adult) (pediatric): Secondary | ICD-10-CM

## 2013-12-26 DIAGNOSIS — Z87891 Personal history of nicotine dependence: Secondary | ICD-10-CM

## 2013-12-26 DIAGNOSIS — Z79899 Other long term (current) drug therapy: Secondary | ICD-10-CM

## 2013-12-26 DIAGNOSIS — F1010 Alcohol abuse, uncomplicated: Secondary | ICD-10-CM | POA: Diagnosis present

## 2013-12-26 HISTORY — DX: Other cardiomyopathies: I42.8

## 2013-12-26 LAB — MAGNESIUM: MAGNESIUM: 2.3 mg/dL (ref 1.5–2.5)

## 2013-12-26 LAB — BASIC METABOLIC PANEL
BUN: 20 mg/dL (ref 6–23)
CALCIUM: 9.6 mg/dL (ref 8.4–10.5)
CO2: 28 mEq/L (ref 19–32)
CREATININE: 1.1 mg/dL (ref 0.50–1.35)
Chloride: 101 mEq/L (ref 96–112)
GLUCOSE: 115 mg/dL — AB (ref 70–99)
Potassium: 5 mEq/L (ref 3.5–5.3)
Sodium: 142 mEq/L (ref 135–145)

## 2013-12-26 MED ORDER — SODIUM CHLORIDE 0.9 % IV SOLN: 250.0000 mL | INTRAVENOUS | Status: DC | PRN

## 2013-12-26 MED ORDER — METOPROLOL SUCCINATE ER 50 MG PO TB24
75.0000 mg | ORAL_TABLET | Freq: Two times a day (BID) | ORAL | Status: DC
  Administered 2013-12-26 – 2013-12-31 (×8): 75 mg via ORAL
  Filled 2013-12-26 (×11): qty 1

## 2013-12-26 MED ORDER — SODIUM CHLORIDE 0.9 % IJ SOLN: 3.0000 mL | INTRAMUSCULAR | Status: DC | PRN

## 2013-12-26 MED ORDER — APIXABAN 5 MG PO TABS
5.0000 mg | ORAL_TABLET | Freq: Two times a day (BID) | ORAL | Status: DC
  Administered 2013-12-26 – 2013-12-31 (×10): 5 mg via ORAL
  Filled 2013-12-26 (×11): qty 1

## 2013-12-26 MED ORDER — DOFETILIDE 500 MCG PO CAPS
500.0000 ug | ORAL_CAPSULE | Freq: Two times a day (BID) | ORAL | Status: DC
  Administered 2013-12-26 – 2013-12-30 (×8): 500 ug via ORAL
  Filled 2013-12-26 (×11): qty 1

## 2013-12-26 MED ORDER — DILTIAZEM HCL ER COATED BEADS 120 MG PO CP24
120.0000 mg | ORAL_CAPSULE | Freq: Every day | ORAL | Status: DC
  Administered 2013-12-27 – 2013-12-29 (×2): 120 mg via ORAL
  Filled 2013-12-26 (×4): qty 1

## 2013-12-26 MED ORDER — SODIUM CHLORIDE 0.9 % IJ SOLN
3.0000 mL | Freq: Two times a day (BID) | INTRAMUSCULAR | Status: DC
  Administered 2013-12-26 – 2013-12-30 (×9): 3 mL via INTRAVENOUS

## 2013-12-26 NOTE — Progress Notes (Signed)
Patient has home CPAP unit in his room.  Patient places on/off himself.

## 2013-12-26 NOTE — Progress Notes (Signed)
Pharmacy Consult for Dofetilide (Tikosyn) Iniation  Admit Complaint: 48 y.o. male admitted 12/26/2013 with atrial fibrillation to be initiated on dofetilide.   Assessment:  Patient Exclusion Criteria: If any screening criteria checked as "Yes", then  patient  should NOT receive dofetilide until criteria item is corrected. If "Yes" please indicate correction plan.  YES  NO Patient  Exclusion Criteria Correction Plan  []  [x]  Baseline QTc interval is greater than or equal to 440 msec. IF above YES box checked dofetilide contraindicated unless patient has ICD; then may proceed if QTc 500-550 msec or with known ventricular conduction abnormalities may proceed with QTc 550-600 msec. QTc = 437 (done 12/26/13)   []  [x]  Magnesium level is less than 1.8 mEq/l : Last magnesium:   Lab Results  Component Value Date   MG 2.3 12/26/2013         []  [x]  Potassium level is less than 4 mEq/l : Last potassium:  Lab Results  Component Value Date   K 5.0 12/26/2013         []  [x]  Patient is known or suspected to have a digoxin level greater than 2 ng/ml: No results found for this basename: DIGOXIN   Not on digoxin PTA   []  [x]  Creatinine clearance less than 20 ml/min (calculated using Cockcroft-Gault, actual body weight and serum creatinine): Estimated Creatinine Clearance: 104.6 ml/min (by C-G formula based on Cr of 1.1).    []  [x]  Patient has received drugs known to prolong the QT intervals within the last 48 hours(phenothiazines, tricyclics or tetracyclic antidepressants, erythromycin, H-1 antihistamines, cisapride, fluoroquinolones, azithromycin). Drugs not listed above may have an, as yet, undetected potential to prolong the QT interval, updated information on QT prolonging agents is available at this website:QT prolonging agents   []  [x]  Patient received a dose of hydrochlorothiazide (Oretic) alone or in any combination including triamterene (Dyazide, Maxzide) in the last 48 hours.   []  [x]  Patient  received a medication known to increase dofetilide plasma concentrations prior to initial dofetilide dose:    Trimethoprim (Primsol, Proloprim) in the last 36 hours   Verapamil (Calan, Verelan) in the last 36 hours or a sustained release dose in the last 72 hours   Megestrol (Megace) in the last 5 days    Cimetidine (Tagamet) in the last 6 hours   Ketoconazole (Nizoral) in the last 24 hours   Itraconazole (Sporanox) in the last 48 hours    Prochlorperazine (Compazine) in the last 36 hours    []  [x]  Patient is known to have a history of torsades de pointes; congenital or acquired long QT syndromes.   []  [x]  Patient has received a Class 1 antiarrhythmic with less than 2 half-lives since last dose. (Disopyramide, Quinidine, Procainamide, Lidocaine, Mexiletine, Flecainide, Propafenone)   []  [x]  Patient has received amiodarone therapy in the past 3 months or amiodarone level is greater than 0.3 ng/ml.    Patient has been appropriately anticoagulated with Apixaban  Ordering provider was confirmed at [email or web address] if they are not listed on the [place] Prescribers list.  Goal of Therapy:  Follow renal function, electrolytes, potential drug interactions, and dose adjustment. Provide education and 1 week supply at discharge.  Plan:  1.  Initiate dofetilide based on renal function: Select One Calculated CrCl  Dose q12h  [x]  > 60 ml/min 500 mcg  []  40-60 ml/min 250 mcg  []  20-40 ml/min 125 mcg   2. Follow up QTc after the first 5 doses, renal function, electrolytes (K & Mg)  daily x 3     days, dose adjustment, success of initiation and facilitate 1 week discharge supply as     clinically indicated.  3. Initiate Tikosyn education video (Call [phone] and ask for video # [phone]).  4. Place Enrollment Form on the chart for discharge supply of dofetilide.  Alycia Rossetti, PharmD, BCPS Clinical Pharmacist Pager: [phone] 12/26/2013 7:11 PM

## 2013-12-26 NOTE — Progress Notes (Signed)
Larry Evans is a 48 y.o. Male who presents today for Tikosyn initation.  He was seen by Dr. Rayann Heman last week.  He reports initially being diagnosed with atrial fibrillation 12/14, though in retrospect, he has had palpitations "for years". He reports symptoms of fatigue and decreased exercise tolerance with his afib.  Patient has a h/o heavy alcohol use and also OSA, which were felt to be likely reason for his Afib.  Dr. Rayann Heman felt given his decreased EF, that Tikosyn would be the best option, and if that failed ablation could be considered at a later date.  Patient was agreeable to this.  He is currently on metoprolol and diltiazem for rate control.  Plan to admit for Tikosyn initiation.    Patient and wife educated on potential side effects of Tikosyn, including QTc prolongation.  They were told what to expect for the 3 days they will be in the hospital.  They are aware of the importance of compliance and will the office if he misses more than 2 doses in a row.  Patient was given a copay card while in the office since he has private insurance to help with the cost of the medication.  Reviewed patient's medication list.  He is currently not taking any QTc prolongating medications or contraindicated medications.  He has been anticoagulated with Eliquis, and has not missed any doses in the past 4 weeks.    EKG on 12/26/13 reviewed by Dr. Rayann Heman showed atrial fibrillation with v rate of 92 bpm, and QTc of 437 msec.  Current Outpatient Prescriptions  Medication Sig Dispense Refill  . apixaban (ELIQUIS) 5 MG TABS tablet Take 1 tablet (5 mg total) by mouth 2 (two) times daily.  60 tablet  6  . diltiazem (CARDIZEM CD) 120 MG 24 hr capsule Take 1 capsule (120 mg total) by mouth daily.  30 capsule  5  . furosemide (LASIX) 20 MG tablet Take 20 mg by mouth as needed.       . metoprolol succinate (TOPROL-XL) 50 MG 24 hr tablet Take one and one half tablet twice daily Take with or immediately following a meal.   90 tablet  11   No current facility-administered medications for this visit.   No Known Allergies

## 2013-12-26 NOTE — H&P (Signed)
ELECTROPHYSIOLOGY ADMISSION HISTORY & PHYSICAL   Patient ID: Larry Evans MRN: [id number], DOB/AGE: 49-05-1966   Admit date: 12/26/2013  Primary Physician: Milagros Evener, MD Primary Cardiologist: Stanford Breed, MD Primary EP: Rayann Heman, MD Reason for Admission: AAD drug loading with Tikosyn for treatment of atrial fibrillation  History of Present Illness Larry Evans is a 48 y.o. male with persistent atrial fibrillation, alcohol abuse and sleep apnea (compliant with CPAP) who presented for EP consultation with Dr. Rayann Heman on 12/19/2013. Treatment options were reviewed and he decided to proceed with elective admission for AAD drug loading with Tikosyn.   He reports initially being diagnosed with atrial fibrillation Dec 2014, though in retrospect, he has had palpitations "for years". He reports symptoms of fatigue and decreased exercise tolerance. Echo Jan 2015 revealed LVEF 35-40% with biatrial enlargement. He was initiated on Eliquis as well as rate control. He underwent cardioversion 10/05/13. He reports significant improvement in exercise tolerance and fatigue but has unfortunately returned to atrial fibrillation. He drinks alcohol regularly. He is trying to "cut back". He has also been diagnosed with OSA and uses his CPAP.   Today, he denies chest pain, shortness of breath, orthopnea, PND, lower extremity edema, dizziness or syncope. He was seen in the office today for pre-admission testing. He has been educated on potential side effects of Tikosyn, specifically QT prolongation, and importance of compliance. He is compliant with Eliquis and has not missed any doses in the past 4 weeks. ECG today reviewed by Dr. Rayann Heman in the office showed atrial fibrillation with V rate 92 bpm, QTc 437.  Past Medical History Past Medical History  Diagnosis Date  . Persistent atrial fibrillation     a. dx 08/2013 => Apixaban started 08/31/13  . Obstructive sleep apnea     uses CPAP  . Dysrhythmia     Past Surgical History Past Surgical History  Procedure Laterality Date  . Hernia repair    . Cardioversion N/A 10/05/2013    Procedure: CARDIOVERSION;  Surgeon: Fay Records, MD;  Location: [facility] ENDOSCOPY;  Service: Cardiovascular;  Laterality: N/A;  12:35       elective cardioversion, Propofol  60  mg, IV adminiter by Dr. Gwenlyn Fudge, anesthesia...synch 200 joules by Dr. Harrington Challenger.....converted to NSR....    Allergies/Intolerances No Known Allergies  Current Home Medications      apixaban 5 MG Tabs tablet  Commonly known as:  ELIQUIS  Take 1 tablet (5 mg total) by mouth 2 (two) times daily.     diltiazem 120 MG 24 hr capsule  Commonly known as:  CARDIZEM CD  Take 1 capsule (120 mg total) by mouth daily.     furosemide 20 MG tablet  Commonly known as:  LASIX  Take 20 mg by mouth as needed for fluid.     metoprolol succinate 50 MG 24 hr tablet  Commonly known as:  TOPROL-XL  Take 75 mg by mouth 2 (two) times daily. Take with or immediately following a meal.     Family History Family History  Problem Relation Age of Onset  . Heart disease      No family history  . Lung cancer Maternal Grandmother   . Throat cancer Maternal Grandfather      Social History History   Social History  . Marital Status: Divorced    Spouse Name: N/A    Number of Children: 1  . Years of Education: N/A   Occupational History  .      Programmer, applications  Social History Main Topics  . Smoking status: Former Smoker -- 0.25 packs/day for 5 years    Types: Cigarettes    Quit date: 10/05/2012  . Smokeless tobacco: Never Used  . Alcohol Use: 1.2 oz/week    2 Cans of beer per week     Comment: heavy ETOH  . Drug Use: No  . Sexual Activity: Yes    Birth Control/ Protection: Condom   Other Topics Concern  . Not on file   Social History Narrative  . No narrative on file     Review of Systems General: No chills, fever, night sweats or weight changes  Cardiovascular:  No chest pain, dyspnea on  exertion, edema, orthopnea, palpitations, paroxysmal nocturnal dyspnea Dermatological: No rash, lesions or masses Respiratory: No cough, dyspnea Urologic: No hematuria, dysuria Abdominal: No nausea, vomiting, diarrhea, bright red blood per rectum, melena, or hematemesis Neurologic: No visual changes, weakness, changes in mental status All other systems reviewed and are otherwise negative except as noted above.  Physical Exam Vitals: Blood pressure 148/94, pulse 86, temperature 98 F (36.7 C), temperature source Oral, resp. rate 18, height 6' (1.829 m), weight 245 lb (111.131 kg).  General: Well developed, well appearing 48 y.o. male in no acute distress. HEENT: Normocephalic, atraumatic. EOMs intact. Sclera nonicteric. Oropharynx clear.  Neck: Supple. No JVD. Lungs: Respirations regular and unlabored, CTA bilaterally. No wheezes, rales or rhonchi. Heart: Irregular. S1, S2 present. No murmurs, rub, S3 or S4. Abdomen: Soft, non-distended.  Extremities: No clubbing, cyanosis or edema. DP/PT/Radials 2+ and equal bilaterally. Psych: Normal affect. Neuro: Alert and oriented X 3. Moves all extremities spontaneously. Musculoskeletal: No kyphosis. Skin: Intact. Warm and dry. No rashes or petechiae in exposed areas.   Labs   Recent Labs Lab 12/26/13 1121  NA 142  K 5.0  CL 101  CO2 28  BUN 20  CREATININE 1.10  CALCIUM 9.6  GLUCOSE 115*  Magnesium 2.3   12-lead ECG done in our office today reviewed by Dr. Rayann Heman - atrial fibrillation with V rate 92 bpm, QTc 437. 12-lead ECG on admission - pending Telemetry - atrial fibrillation with controlled V rate  Assessment and Plan Persistent atrial fibrillation  Non-ischemic CM Chronic systolic HF  Larry Evans presents with symptomatic, persistent atrial fibrillation. He has been evaluated recently and deemed stable for Tikosyn initiation by Dr. Rayann Heman. Please see recent office note dated 12/19/2013. His labs today show serum Cr 1.1 with  normal potassium and normal magnesium. His ECG shows corrected QTc 437 msec. He is compliant with Eliquis and has not missed any doses in the last 4 weeks. Also, Larry Evans was advised to abstain from drinking alcohol. He agreed to comply. Given all of the above, will start Tikosyn loading per protocol.   Signed, Andrez Grime, PA-C 12/26/2013, 6:32 PM  Electrophysiology attending  Patient seen and examined, I have her view the findings as documented in the H&P above. Minimal amendments have been made. The patient is admitted for initiation of dofetilide therapy. EKG and electrolytes have been reviewed. He will be admitted for approximately 3 days, and undergo cardioversion, if he does not revert to sinus rhythm spontaneously while initiating dofetilide.  Cristopher Peru, M.D.

## 2013-12-26 NOTE — Assessment & Plan Note (Addendum)
Patient's lab reviewed:  K - 5.0, Mg - 2.3, Scr 1.1, CrCl > 100 ml/min.  Acceptable for Tikosyn loading, and recommend starting with 500 mcg bid.  QTc 437 sec, which is acceptable.  Patient aware to report to hospital and will be placed on 3 W.

## 2013-12-27 ENCOUNTER — Encounter: Payer: Self-pay | Admitting: Pharmacist

## 2013-12-27 DIAGNOSIS — G4733 Obstructive sleep apnea (adult) (pediatric): Secondary | ICD-10-CM

## 2013-12-27 LAB — BASIC METABOLIC PANEL
BUN: 19 mg/dL (ref 6–23)
CO2: 26 mEq/L (ref 19–32)
Calcium: 9.1 mg/dL (ref 8.4–10.5)
Chloride: 103 mEq/L (ref 96–112)
Creatinine, Ser: 1.04 mg/dL (ref 0.50–1.35)
GFR calc Af Amer: >90 mL/min (ref >90)
GFR, EST NON AFRICAN AMERICAN: 83 mL/min — AB (ref >90)
GLUCOSE: 114 mg/dL — AB (ref 70–99)
POTASSIUM: 4.7 meq/L (ref 3.7–5.3)
Sodium: 141 mEq/L (ref 137–147)

## 2013-12-27 LAB — MAGNESIUM: Magnesium: 2.2 mg/dL (ref 1.5–2.5)

## 2013-12-27 NOTE — Progress Notes (Signed)
UR Completed Lilo Wallington Graves-Bigelow, RN,BSN [phone]  

## 2013-12-27 NOTE — Care Management Note (Unsigned)
    Page 1 of 1   12/27/2013     11:27:15 AM CARE MANAGEMENT NOTE 12/27/2013  Patient:  VED, MARTOS   Account Number:  [id number]  Date Initiated:  12/27/2013  Documentation initiated by:  GRAVES-BIGELOW,Lesle Faron  Subjective/Objective Assessment:   Pt admitted for Tikosyn Load.     Action/Plan:   Co pay for Tikosyn $93.30-No Prior authorization needed. Pt has 10.00 co pay card. Pt will need a Rx for 7 day supply to be filled via the main Pharmacy and the original Rx with refills. CM will assist   Anticipated DC Date:  12/30/2013   Anticipated DC Plan:  [place]  CM consult  Medication Assistance      Choice offered to / List presented to:             Status of service:  In process, will continue to follow Medicare Important Message given?   (If response is "NO", the following Medicare IM given date fields will be blank) Date Medicare IM given:   Date Additional Medicare IM given:    Discharge Disposition:    Per UR Regulation:  Reviewed for med. necessity/level of care/duration of stay  If discussed at [place] of Stay Meetings, dates discussed:    Comments:  12-27-13 [place], RN,BSN [phone] CM did call [facility] on [address] and they can order medication.

## 2013-12-27 NOTE — Progress Notes (Signed)
    Patient: Larry Evans Date of Encounter: 12/27/2013, 11:24 AM Admit date: 12/26/2013     Subjective  Larry Evans has no complaints this AM. He denies CP, SOB or palpitations.   Objective  Physical Exam: Vitals: BP 126/86  Pulse 106  Temp(Src) 97.4 F (36.3 C) (Oral)  Resp 18  Ht 6' (1.829 m)  Wt 245 lb 0.3 oz (111.141 kg)  BMI 33.22 kg/m2  SpO2 98% General: Well developed, well appearing 48 year old male in no acute distress. Neck: Supple. JVD not elevated. Lungs: Clear bilaterally to auscultation without wheezes, rales, or rhonchi. Breathing is unlabored. Heart: Irregular S1 S2 without murmurs, rubs, or gallops.  Abdomen: Soft, non-distended. Extremities: No clubbing or cyanosis. No edema.  Distal pedal pulses are 2+ and equal bilaterally. Neuro: Alert and oriented X 3. Moves all extremities spontaneously. No focal deficits.  Intake/Output:  Intake/Output Summary (Last 24 hours) at 12/27/13 1124 Last data filed at 12/27/13 1034  Gross per 24 hour  Intake    243 ml  Output      0 ml  Net    243 ml    Inpatient Medications:  . apixaban  5 mg Oral BID  . diltiazem  120 mg Oral Daily  . dofetilide  500 mcg Oral BID  . metoprolol succinate  75 mg Oral BID  . sodium chloride  3 mL Intravenous Q12H    Labs:  Recent Labs  12/26/13 1121 12/27/13 0530  NA 142 141  K 5.0 4.7  CL 101 103  CO2 28 26  GLUCOSE 115* 114*  BUN 20 19  CREATININE 1.10 1.04  CALCIUM 9.6 9.1  MG 2.3 2.2    Telemetry: AFib with V rate in 90s   Assessment and Plan   1. Persistent atrial fibrillation  - rate controlled - serum Cr, potassium and magnesium are normal - QTc stable - continue current Tikosyn dosing  2. Non-ischemic CM with chronic systolic HF - no evidence of volume overload - continue medical therapy  Signed, Azzie Roup Edmisten PA-C  I have seen, examined the patient, and reviewed the above assessment and plan.  Changes to above are made where necessary.  Pt  remains in afib after 2 doses of tikosyn.  Will plan cardioversion tomorrow if remains in afib.  Co Sign: Thompson Grayer, MD 12/27/2013 4:53 PM

## 2013-12-27 NOTE — Discharge Instructions (Addendum)
**   PLEASE REMEMBER TO BRING ALL OF YOUR MEDICATIONS TO EACH OF YOUR FOLLOW-UP OFFICE VISITS. ** DO NOT START ANY NEW MEDICATIONS (OVER-THE-COUNTER OR PRESCRIPTION) WITHOUT NOTIFYING DR. ALLRED FIRST.  Information on my medicine - ELIQUIS (apixaban)  This medication education was reviewed with me or my healthcare representative as part of my discharge preparation.  The pharmacist that spoke with me during my hospital stay was:  Pie [place], RPH  Why was Eliquis prescribed for you? Eliquis was prescribed for you to reduce the risk of a blood clot forming that can cause a stroke if you have a medical condition called atrial fibrillation (a type of irregular heartbeat).  What do You need to know about Eliquis ? Take your Eliquis TWICE DAILY - one tablet in the morning and one tablet in the evening with or without food. If you have difficulty swallowing the tablet whole please discuss with your pharmacist how to take the medication safely.  Take Eliquis exactly as prescribed by your doctor and DO NOT stop taking Eliquis without talking to the doctor who prescribed the medication.  Stopping may increase your risk of developing a stroke.  Refill your prescription before you run out.  After discharge, you should have regular check-up appointments with your healthcare provider that is prescribing your Eliquis.  In the future your dose may need to be changed if your kidney function or weight changes by a significant amount or as you get older.  What do you do if you miss a dose? If you miss a dose, take it as soon as you remember on the same day and resume taking twice daily.  Do not take more than one dose of ELIQUIS at the same time to make up a missed dose.  Important Safety Information A possible side effect of Eliquis is bleeding. You should call your healthcare provider right away if you experience any of the following:   Bleeding from an injury or your nose that does not stop.   Unusual  colored urine (red or dark brown) or unusual colored stools (red or black).   Unusual bruising for unknown reasons.   A serious fall or if you hit your head (even if there is no bleeding).  Some medicines may interact with Eliquis and might increase your risk of bleeding or clotting while on Eliquis. To help avoid this, consult your healthcare provider or pharmacist prior to using any new prescription or non-prescription medications, including herbals, vitamins, non-steroidal anti-inflammatory drugs (NSAIDs) and supplements.  This website has more information on Eliquis (apixaban): [email or web address].no.

## 2013-12-27 NOTE — Progress Notes (Signed)
Patient had 5 beats VTACH, asymptomatic.  Sitting in bed talking with family.  Dr. Rayann Heman on floor and made aware.  Will continue to monitor.  Sanda Linger

## 2013-12-27 NOTE — Progress Notes (Signed)
Patient has home CPAP in his room that he places on/off himself.

## 2013-12-28 ENCOUNTER — Encounter (HOSPITAL_COMMUNITY): Payer: Self-pay | Admitting: Certified Registered"

## 2013-12-28 ENCOUNTER — Encounter (HOSPITAL_COMMUNITY): Payer: Managed Care, Other (non HMO) | Admitting: Certified Registered"

## 2013-12-28 ENCOUNTER — Ambulatory Visit: Payer: Managed Care, Other (non HMO) | Admitting: Cardiology

## 2013-12-28 ENCOUNTER — Inpatient Hospital Stay (HOSPITAL_COMMUNITY): Payer: Managed Care, Other (non HMO) | Admitting: Certified Registered"

## 2013-12-28 ENCOUNTER — Encounter (HOSPITAL_COMMUNITY)
Admission: AD | Disposition: A | Discharge status: Home / Self Care | Payer: Self-pay | Source: Ambulatory Visit | Attending: Internal Medicine

## 2013-12-28 DIAGNOSIS — I4891 Unspecified atrial fibrillation: Secondary | ICD-10-CM

## 2013-12-28 HISTORY — PX: CARDIOVERSION: SHX1299

## 2013-12-28 LAB — MAGNESIUM: Magnesium: 2.1 mg/dL (ref 1.5–2.5)

## 2013-12-28 LAB — BASIC METABOLIC PANEL
BUN: 19 mg/dL (ref 6–23)
CO2: 23 meq/L (ref 19–32)
Calcium: 9.3 mg/dL (ref 8.4–10.5)
Chloride: 102 mEq/L (ref 96–112)
Creatinine, Ser: 0.96 mg/dL (ref 0.50–1.35)
GFR calc Af Amer: >90 mL/min (ref >90)
GFR calc non Af Amer: >90 mL/min (ref >90)
Glucose, Bld: 109 mg/dL — ABNORMAL HIGH (ref 70–99)
POTASSIUM: 4.5 meq/L (ref 3.7–5.3)
SODIUM: 139 meq/L (ref 137–147)

## 2013-12-28 SURGERY — CARDIOVERSION
Anesthesia: General

## 2013-12-28 MED ORDER — PROPOFOL 10 MG/ML IV BOLUS
INTRAVENOUS | Status: DC | PRN
  Administered 2013-12-28: 120 mg via INTRAVENOUS

## 2013-12-28 MED ORDER — LIDOCAINE HCL (CARDIAC) 20 MG/ML IV SOLN
INTRAVENOUS | Status: DC | PRN
  Administered 2013-12-28: 60 mg via INTRAVENOUS

## 2013-12-28 NOTE — Progress Notes (Signed)
   SUBJECTIVE: The patient is doing well today.  At this time, he denies chest pain, shortness of breath, or any new concerns.  Admitted for Tikosyn loading 12-26-13 - for DCCV this am.   Labs stable, QTc stable.   CURRENT MEDICATIONS: . apixaban  5 mg Oral BID  . diltiazem  120 mg Oral Daily  . dofetilide  500 mcg Oral BID  . metoprolol succinate  75 mg Oral BID  . sodium chloride  3 mL Intravenous Q12H      OBJECTIVE: Physical Exam: Filed Vitals:   12/27/13 0526 12/27/13 1435 12/27/13 2241 12/28/13 0601  BP: 126/86 141/72 125/89 120/92  Pulse: 106 53 63 72  Temp: 97.4 F (36.3 C) 98 F (36.7 C) 97.8 F (36.6 C) 97.4 F (36.3 C)  TempSrc: Oral Oral Oral Oral  Resp: 18 19 18 18   Height:      Weight: 245 lb 0.3 oz (111.141 kg)   246 lb 14.8 oz (112.005 kg)  SpO2: 98% 97% 98% 98%    Intake/Output Summary (Last 24 hours) at 12/28/13 6378 Last data filed at 12/27/13 1034  Gross per 24 hour  Intake    243 ml  Output      0 ml  Net    243 ml    Telemetry reveals atrial fibrillation, rate 90-110, occasional PVC's  GEN- The patient is well appearing, alert and oriented x 3 today.   Head- normocephalic, atraumatic Eyes-  Sclera clear, conjunctiva pink Ears- hearing intact Oropharynx- clear Neck- supple, no JVP Lymph- no cervical lymphadenopathy Lungs- Clear to ausculation bilaterally, normal work of breathing Heart- irregular rate and rhythm, no murmurs, rubs or gallops, PMI not laterally displaced GI- soft, NT, ND, + BS Extremities- no clubbing, cyanosis, or edema Neuro- strength and sensation are intact  LABS: Basic Metabolic Panel:  Recent Labs  12/26/13 1121 12/27/13 0530  NA 142 141  K 5.0 4.7  CL 101 103  CO2 28 26  GLUCOSE 115* 114*  BUN 20 19  CREATININE 1.10 1.04  CALCIUM 9.6 9.1  MG 2.3 2.2     ASSESSMENT AND PLAN:  Active Problems:   AF (atrial fibrillation)  1. Persistent afib Plans for cardioversion this am Continue current tikosyn  dosing and eliquis Resume dilt/ metoprolol post cardioversion depending on heart rates (held this am)  2. OSA Compliance with CPAP encouraged  If he is able to achieve and maintain sinus then I anticipate discharge tomorrow am

## 2013-12-28 NOTE — H&P (View-Only) (Signed)
   SUBJECTIVE: The patient is doing well today.  At this time, he denies chest pain, shortness of breath, or any new concerns.  Admitted for Tikosyn loading 12-26-13 - for DCCV this am.   Labs stable, QTc stable.   CURRENT MEDICATIONS: . apixaban  5 mg Oral BID  . diltiazem  120 mg Oral Daily  . dofetilide  500 mcg Oral BID  . metoprolol succinate  75 mg Oral BID  . sodium chloride  3 mL Intravenous Q12H      OBJECTIVE: Physical Exam: Filed Vitals:   12/27/13 0526 12/27/13 1435 12/27/13 2241 12/28/13 0601  BP: 126/86 141/72 125/89 120/92  Pulse: 106 53 63 72  Temp: 97.4 F (36.3 C) 98 F (36.7 C) 97.8 F (36.6 C) 97.4 F (36.3 C)  TempSrc: Oral Oral Oral Oral  Resp: 18 19 18 18  Height:      Weight: 245 lb 0.3 oz (111.141 kg)   246 lb 14.8 oz (112.005 kg)  SpO2: 98% 97% 98% 98%    Intake/Output Summary (Last 24 hours) at 12/28/13 0639 Last data filed at 12/27/13 1034  Gross per 24 hour  Intake    243 ml  Output      0 ml  Net    243 ml    Telemetry reveals atrial fibrillation, rate 90-110, occasional PVC's  GEN- The patient is well appearing, alert and oriented x 3 today.   Head- normocephalic, atraumatic Eyes-  Sclera clear, conjunctiva pink Ears- hearing intact Oropharynx- clear Neck- supple, no JVP Lymph- no cervical lymphadenopathy Lungs- Clear to ausculation bilaterally, normal work of breathing Heart- irregular rate and rhythm, no murmurs, rubs or gallops, PMI not laterally displaced GI- soft, NT, ND, + BS Extremities- no clubbing, cyanosis, or edema Neuro- strength and sensation are intact  LABS: Basic Metabolic Panel:  Recent Labs  12/26/13 1121 12/27/13 0530  NA 142 141  K 5.0 4.7  CL 101 103  CO2 28 26  GLUCOSE 115* 114*  BUN 20 19  CREATININE 1.10 1.04  CALCIUM 9.6 9.1  MG 2.3 2.2     ASSESSMENT AND PLAN:  Active Problems:   AF (atrial fibrillation)  1. Persistent afib Plans for cardioversion this am Continue current tikosyn  dosing and eliquis Resume dilt/ metoprolol post cardioversion depending on heart rates (held this am)  2. OSA Compliance with CPAP encouraged  If he is able to achieve and maintain sinus then I anticipate discharge tomorrow am  

## 2013-12-28 NOTE — CV Procedure (Signed)
    Cardioversion Note  Larry Evans [id number] 30-Aug-1966  Procedure: DC Cardioversion Indications: atrial fib   Procedure Details Consent: Obtained Time Out: Verified patient identification, verified procedure, site/side was marked, verified correct patient position, special equipment/implants available, Radiology Safety Procedures followed,  medications/allergies/relevent history reviewed, required imaging and test results available.  Performed  The patient has been on adequate anticoagulation.  The patient received IV Lidocaine 60 mg followed by Propofol 120 mg iv  for sedation.  Synchronous cardioversion was performed at 120  joules.  The cardioversion was successful     Complications: No apparent complications Patient did tolerate procedure well.   Thayer Headings, Brooke Bonito., MD, [facility] 12/28/2013, 10:23 AM

## 2013-12-28 NOTE — Transfer of Care (Signed)
Immediate Anesthesia Transfer of Care Note  Patient: Larry Evans  Procedure(s) Performed: Procedure(s): CARDIOVERSION BEDSIDE (N/A)  Patient Location: Nursing Unit  Anesthesia Type:General  Level of Consciousness: awake, alert  and oriented  Airway & Oxygen Therapy: Patient Spontanous Breathing and Patient connected to nasal cannula oxygen  Post-op Assessment: Report given to PACU RN and Post -op Vital signs reviewed and stable  Post vital signs: Reviewed and stable  Complications: No apparent anesthesia complications

## 2013-12-28 NOTE — Interval H&P Note (Signed)
History and Physical Interval Note:  12/28/2013 9:25 AM  Eloy End  has presented today for surgery, with the diagnosis of AFIB  The various methods of treatment have been discussed with the patient and family. After consideration of risks, benefits and other options for treatment, the patient has consented to  Procedure(s): CARDIOVERSION BEDSIDE (N/A) as a surgical intervention .  The patient's history has been reviewed, patient examined, no change in status, stable for surgery.  I have reviewed the patient's chart and labs.  Questions were answered to the patient's satisfaction.     Wonda Cheng Charistopher Rumble

## 2013-12-28 NOTE — Discharge Summary (Signed)
     ELECTROPHYSIOLOGY PROCEDURE DISCHARGE SUMMARY    Patient ID: Larry Evans,  MRN: [id number], DOB/AGE: [birth year] 48 y.o.  Admit date: 12/26/2013 Discharge date: 12/29/2013  Primary Care Physician: Milagros Evener, MD Primary Cardiologist: Stanford Breed Electrophysiologist: Steffi Noviello  Primary Discharge Diagnosis:  1.  Persistent atrial fibrillation status post Tikosyn loading this admission  Secondary Discharge Diagnosis:  1.  ETOH abuse 2.  Sleep apnea - on CPAP 3.  Non ischemic cardiomyopathy  No Known Allergies   Procedures This Admission:  1.  Tikosyn loading 2.  Direct current cardioversion on 12-28-13 by Dr Acie Fredrickson which successfully restored SR.  There were no early apparent complications.   Brief HPI: Larry Evans is a 48 y.o. male with a past medical history as noted above.  He was referred to EP in the outpatient setting for treatment options of atrial fibrillation.  Risks, benefits, and alternatives to Tikosyn were reviewed with the patient who wished to proceed.    Hospital Course:  The patient was admitted and Tikosyn was initiated.  Renal function and electrolytes were followed during the hospitalization.  Their QTc remained stable.  On 12-28-13 he underwent direct current cardioversion which restored sinus rhythm.  [name] was monitored until discharge on telemetry which demonstrated sinus bradycardia.  On the day of discharge, they were examined by Dr Rayann Heman who considered them stable for discharge to home.  Follow-up has been arranged with [facility] pharmacists in 1 week and with Dr Stanford Breed in 4 weeks.   Discharge Vitals: Blood pressure 129/80, pulse 64, temperature 97.7 F (36.5 C), temperature source Oral, resp. rate 18, height 6' (1.829 m), weight 246 lb 14.8 oz (112.005 kg), SpO2 99.00%.    Labs:   Lab Results  Component Value Date   WBC 8.4 09/18/2013   HGB 16.0 09/18/2013   HCT 46.5 09/18/2013   MCV 88.4 09/18/2013   PLT 227.0 09/18/2013     Recent  Labs Lab 12/29/13 0601  NA 140  K 4.2  CL 104  CO2 24  BUN 18  CREATININE 0.90  CALCIUM 9.0  GLUCOSE 114*     Discharge Medications:    Medication List    ASK your doctor about these medications       apixaban 5 MG Tabs tablet  Commonly known as:  ELIQUIS  Take 1 tablet (5 mg total) by mouth 2 (two) times daily.     diltiazem 120 MG 24 hr capsule  Commonly known as:  CARDIZEM CD  Take 1 capsule (120 mg total) by mouth daily.     furosemide 20 MG tablet  Commonly known as:  LASIX  Take 20 mg by mouth as needed for fluid.     metoprolol succinate 50 MG 24 hr tablet  Commonly known as:  TOPROL-XL  Take 75 mg by mouth 2 (two) times daily. Take with or immediately following a meal.        Disposition:   Future Appointments Provider Department Dept Phone   01/08/2014 10:00 AM Aris [place], [place] Office [phone]   01/11/2014 3:45 PM Kathee Delton, MD [facility] [phone]   02/07/2014 3:15 PM Lelon Perla, MD [place] [facility] [phone]       Duration of Discharge Encounter: Greater than 30 minutes including physician time.  Signed,   Thompson Grayer MD

## 2013-12-28 NOTE — Anesthesia Postprocedure Evaluation (Signed)
  Anesthesia Post-op Note  Patient: Larry Evans End  Procedure(s) Performed: Procedure(s): CARDIOVERSION BEDSIDE (N/A)  Patient Location: Nursing Unit  Anesthesia Type:General  Level of Consciousness: awake, alert  and oriented  Airway and Oxygen Therapy: Patient Spontanous Breathing and Patient connected to nasal cannula oxygen  Post-op Pain: none  Post-op Assessment: Post-op Vital signs reviewed, Patient's Cardiovascular Status Stable, Respiratory Function Stable, Patent Airway and No signs of Nausea or vomiting  Post-op Vital Signs: Reviewed and stable  Last Vitals:  Filed Vitals:   12/28/13 0601  BP: 120/92  Pulse: 72  Temp: 36.3 C  Resp: 18    Complications: No apparent anesthesia complications

## 2013-12-28 NOTE — Anesthesia Preprocedure Evaluation (Signed)
Anesthesia Evaluation  Patient identified by MRN, date of birth, ID band Patient awake    Reviewed: Allergy & Precautions, H&P , NPO status , Patient's Chart, lab work & pertinent test results, reviewed documented beta blocker date and time   Airway Mallampati: II      Dental   Pulmonary sleep apnea , former smoker,          Cardiovascular + dysrhythmias Atrial Fibrillation     Neuro/Psych    GI/Hepatic   Endo/Other    Renal/GU      Musculoskeletal   Abdominal   Peds  Hematology   Anesthesia Other Findings   Reproductive/Obstetrics                           Anesthesia Physical Anesthesia Plan  ASA: II  Anesthesia Plan: General   Post-op Pain Management:    Induction: Intravenous  Airway Management Planned: Mask  Additional Equipment:   Intra-op Plan:   Post-operative Plan:   Informed Consent: I have reviewed the patients History and Physical, chart, labs and discussed the procedure including the risks, benefits and alternatives for the proposed anesthesia with the patient or authorized representative who has indicated his/her understanding and acceptance.     Plan Discussed with: Anesthesiologist and Surgeon  Anesthesia Plan Comments:         Anesthesia Quick Evaluation

## 2013-12-29 LAB — BASIC METABOLIC PANEL
BUN: 18 mg/dL (ref 6–23)
CHLORIDE: 104 meq/L (ref 96–112)
CO2: 24 mEq/L (ref 19–32)
Calcium: 9 mg/dL (ref 8.4–10.5)
Creatinine, Ser: 0.9 mg/dL (ref 0.50–1.35)
GFR calc Af Amer: >90 mL/min (ref >90)
GFR calc non Af Amer: >90 mL/min (ref >90)
Glucose, Bld: 114 mg/dL — ABNORMAL HIGH (ref 70–99)
POTASSIUM: 4.2 meq/L (ref 3.7–5.3)
Sodium: 140 mEq/L (ref 137–147)

## 2013-12-29 LAB — MAGNESIUM: MAGNESIUM: 2.1 mg/dL (ref 1.5–2.5)

## 2013-12-29 NOTE — Progress Notes (Signed)
2 hour post-dose ECG reviewed.  Sinus Brady, 57, inf, anterolateral twi.  QTc calculates out to 510 msec, which is up from 435 prior to this mornings dose.  Discussed with Dr. Rayann Heman.  We will cancel plans for discharge and keep him today.  We will continue tikosyn @ 500 mcg and will f/u his ECG after his evening dose tonight.  If QTc stable, we will plan to d/c in AM on 500 mcg q12h.  If however, QTc further out, we will have to reduce dose.  Pt and wife aware.

## 2013-12-29 NOTE — Progress Notes (Signed)
   SUBJECTIVE: The patient is doing well today.  At this time, he denies chest pain, shortness of breath, or any new concerns.  S/p DCCV yesterday - maintaining SR  Labs stable, EKG pending  CURRENT MEDICATIONS: . apixaban  5 mg Oral BID  . diltiazem  120 mg Oral Daily  . dofetilide  500 mcg Oral BID  . metoprolol succinate  75 mg Oral BID  . sodium chloride  3 mL Intravenous Q12H      OBJECTIVE: Physical Exam: Filed Vitals:   12/28/13 0601 12/28/13 1444 12/28/13 2229 12/29/13 0543  BP: 120/92 125/62 153/122 129/80  Pulse: 72 59 62 64  Temp: 97.4 F (36.3 C) 97.6 F (36.4 C) 97.7 F (36.5 C) 97.7 F (36.5 C)  TempSrc: Oral Oral Oral Oral  Resp: 18 18 18 18   Height:      Weight: 246 lb 14.8 oz (112.005 kg)     SpO2: 98% 99% 99% 99%    Intake/Output Summary (Last 24 hours) at 12/29/13 0729 Last data filed at 12/28/13 1757  Gross per 24 hour  Intake    820 ml  Output      3 ml  Net    817 ml    Telemetry reveals sinus rhythm, rates 50-70  GEN- The patient is well appearing, alert and oriented x 3 today.   Head- normocephalic, atraumatic Eyes-  Sclera clear, conjunctiva pink Ears- hearing intact Oropharynx- clear Neck- supple, no JVP Lymph- no cervical lymphadenopathy Lungs- Clear to ausculation bilaterally, normal work of breathing Heart- irregular rate and rhythm, no murmurs, rubs or gallops, PMI not laterally displaced GI- soft, NT, ND, + BS Extremities- no clubbing, cyanosis, or edema Neuro- strength and sensation are intact  LABS: Basic Metabolic Panel:  Recent Labs  12/28/13 0546 12/29/13 0601  NA 139 140  K 4.5 4.2  CL 102 104  CO2 23 24  GLUCOSE 109* 114*  BUN 19 18  CREATININE 0.96 0.90  CALCIUM 9.3 9.0  MG 2.1 2.1     ASSESSMENT AND PLAN:  Active Problems:   AF (atrial fibrillation)  1. Persistent afib Maintaining sinus Continue current tikosyn and eliquis Stop diltiazem  2. OSA Compliance with CPAP encouraged  Follow up  with [place] [facility] in 1 week and Dr Stanford Breed in 4 weeks (appts scheduled)

## 2013-12-30 LAB — BASIC METABOLIC PANEL
BUN: 20 mg/dL (ref 6–23)
CHLORIDE: 102 meq/L (ref 96–112)
CO2: 23 meq/L (ref 19–32)
Calcium: 9.2 mg/dL (ref 8.4–10.5)
Creatinine, Ser: 0.9 mg/dL (ref 0.50–1.35)
GFR calc Af Amer: >90 mL/min (ref >90)
GFR calc non Af Amer: >90 mL/min (ref >90)
Glucose, Bld: 111 mg/dL — ABNORMAL HIGH (ref 70–99)
Potassium: 4.4 mEq/L (ref 3.7–5.3)
Sodium: 140 mEq/L (ref 137–147)

## 2013-12-30 MED ORDER — DOFETILIDE 250 MCG PO CAPS
375.0000 ug | ORAL_CAPSULE | Freq: Two times a day (BID) | ORAL | Status: DC
  Administered 2013-12-30 – 2013-12-31 (×2): 375 ug via ORAL
  Filled 2013-12-30 (×4): qty 1

## 2013-12-30 NOTE — Progress Notes (Addendum)
   SUBJECTIVE: The patient is doing well today.  At this time, he denies chest pain, shortness of breath, or any new concerns.  Marland Kitchen apixaban  5 mg Oral BID  . diltiazem  120 mg Oral Daily  . dofetilide  500 mcg Oral BID  . metoprolol succinate  75 mg Oral BID  . sodium chloride  3 mL Intravenous Q12H      OBJECTIVE: Physical Exam: Filed Vitals:   12/29/13 1007 12/29/13 1300 12/29/13 2100 12/30/13 0500  BP: 133/67 114/62 125/72 116/65  Pulse: 63 60 56 50  Temp:  98.1 F (36.7 C) 98 F (36.7 C) 97.6 F (36.4 C)  TempSrc:  Oral    Resp:  18 16 16   Height:      Weight:      SpO2:  98% 96% 98%    Intake/Output Summary (Last 24 hours) at 12/30/13 0754 Last data filed at 12/29/13 1700  Gross per 24 hour  Intake   1260 ml  Output      0 ml  Net   1260 ml    Telemetry reveals sinus rhythm  GEN- The patient is well appearing, alert and oriented x 3 today.   Head- normocephalic, atraumatic Eyes-  Sclera clear, conjunctiva pink Ears- hearing intact Oropharynx- clear Neck- supple, no JVP Lymph- no cervical lymphadenopathy Lungs- Clear to ausculation bilaterally, normal work of breathing Heart- Regular rate and rhythm, no murmurs, rubs or gallops, PMI not laterally displaced GI- soft, NT, ND, + BS Extremities- no clubbing, cyanosis, or edema   LABS: Basic Metabolic Panel:  Recent Labs  12/28/13 0546 12/29/13 0601 12/30/13 0556  NA 139 140 140  K 4.5 4.2 4.4  CL 102 104 102  CO2 23 24 23   GLUCOSE 109* 114* 111*  BUN 19 18 20   CREATININE 0.96 0.90 0.90  CALCIUM 9.3 9.0 9.2  MG 2.1 2.1  --    RADIOLOGY: No results found.  ASSESSMENT AND PLAN:  Active Problems:   AF (atrial fibrillation)  1. afib Qt is < 500 msec this am.  The wide complex rhythm seen on tele is SVT with aberrance (likely pulmonary vein source).  Will continue current medicine. If QT is ok after next tikosyn dose then I will discharge with close outpatient follow-up today.   Thompson Grayer,  MD 12/30/2013 7:54 AM   Addendum QT is prolonged (QTc 530) Decrease tikosyn to 340mcg BID (first dose tonight) If QT is stable then hope to discharge tomorrow  Thompson Grayer MD 11:54 AM 12/30/2013

## 2013-12-30 NOTE — Progress Notes (Signed)
P's follow up EKG after the pm dose of Tikosyn calculates a QTc of 536 msec. Md on call made aware. Will cont to monitor pt.

## 2013-12-30 NOTE — Progress Notes (Signed)
Pt. States that he will place himself on his home CPAP before going to bed. Pt. Was made aware to call RT if he needed assistance with CPAP.

## 2013-12-30 NOTE — Progress Notes (Signed)
Pt resting in bed and had a 13 beat run of multifocal wide QRS tachycardia. Md on call made aware. New order received for a Bmet and to hold AM dose of Tikosyn until MD sees pt. Will cont to monitor pt.

## 2013-12-31 ENCOUNTER — Other Ambulatory Visit: Payer: Self-pay

## 2013-12-31 MED ORDER — DOFETILIDE 125 MCG PO CAPS: 375.0000 ug | ORAL_CAPSULE | Freq: Two times a day (BID) | ORAL | Status: DC

## 2013-12-31 NOTE — Progress Notes (Signed)
   SUBJECTIVE: The patient is doing well today.  At this time, he denies chest pain, shortness of breath, or any new concerns.   QTc stable on decreased dose of Tikosyn  CURRENT MEDICATIONS: . apixaban  5 mg Oral BID  . dofetilide  375 mcg Oral BID  . metoprolol succinate  75 mg Oral BID  . sodium chloride  3 mL Intravenous Q12H      OBJECTIVE: Physical Exam: Filed Vitals:   12/30/13 0500 12/30/13 1000 12/30/13 1438 12/31/13 0500  BP: 116/65  143/82 115/78  Pulse: 50 51 61 55  Temp: 97.6 F (36.4 C)  98 F (36.7 C) 97.5 F (36.4 C)  TempSrc:   Oral   Resp: 16  16 16   Height:      Weight:      SpO2: 98%  97% 98%    Intake/Output Summary (Last 24 hours) at 12/31/13 0622 Last data filed at 12/30/13 2225  Gross per 24 hour  Intake    423 ml  Output      0 ml  Net    423 ml    Telemetry reveals sinus rhythm  GEN- The patient is well appearing, alert and oriented x 3 today.   Head- normocephalic, atraumatic Eyes-  Sclera clear, conjunctiva pink Ears- hearing intact Oropharynx- clear Neck- supple, no JVP Lymph- no cervical lymphadenopathy Lungs- Clear to ausculation bilaterally, normal work of breathing Heart- Regular rate and rhythm, no murmurs, rubs or gallops, PMI not laterally displaced GI- soft, NT, ND, + BS Extremities- no clubbing, cyanosis, or edema   LABS: Basic Metabolic Panel:  Recent Labs  12/29/13 0601 12/30/13 0556  NA 140 140  K 4.2 4.4  CL 104 102  CO2 24 23  GLUCOSE 114* 111*  BUN 18 20  CREATININE 0.90 0.90  CALCIUM 9.0 9.2  MG 2.1  --    RADIOLOGY: No results found.  ASSESSMENT AND PLAN:  Active Problems:   AF (atrial fibrillation)  1. afib Qt is < 500 msec this am.  Tikosyn reduced yesterday to 357mcg bid.  If QT is ok after next tikosyn dose then I will discharge with close outpatient follow-up with Dr Stanford Breed.   Thompson Grayer MD 6:22 AM 12/31/2013

## 2013-12-31 NOTE — Progress Notes (Signed)
Post Tikosyn ECG has been reviewed by Dr. Rayann Heman. Larry Evans is stable for discharge home today.

## 2013-12-31 NOTE — Discharge Summary (Signed)
ELECTROPHYSIOLOGY DISCHARGE SUMMARY   Patient ID: Larry Evans,  MRN: [id number], DOB/AGE: Jan 13, 1966 48 y.o.  Admit date: 12/26/2013 Discharge date: 12/31/2013  Primary Care Physician: Milagros Evener, MD  Primary Cardiologist: Stanford Breed, MD  Electrophysiologist: Rayann Heman, MD   Primary Discharge Diagnosis:  Persistent atrial fibrillation status post AAD drug loading with Tikosyn   Secondary Discharge Diagnoses:  1. ETOH abuse  2. Sleep apnea - on CPAP  3. Non ischemic cardiomyopathy   Procedures This Admission:  Direct current cardioversion on 12/28/2013 by Dr Acie Fredrickson which successfully restored SR. There were no complications.   Brief History:  Larry Evans is a 48 year old man with a past medical history as noted above. He was referred to EP in the outpatient setting for treatment options of atrial fibrillation. Risks, benefits, and alternatives to Tikosyn were reviewed with the patient who wished to proceed.   Hospital Course:  Larry Evans was admitted on 12/26/2013 and Tikosyn was initiated. Renal function and electrolytes were followed during the hospitalization. On 12/28/2013 he underwent DCCV which was successful for restoring SR. His QTc was prolonged and the dose decreased to 375 micrograms every 12 hours. His QTc remained stable at the lower dose. 12/30/2013 he had an episode of wide complex tachycardia which was felt to represent SVT with aberrancy. Larry Evans continued to be monitored until discharge on telemetry which demonstrated sinus bradycardia without recurrent arrhythmia. He has been seen, examined and deemed stable for discharge home today by Dr. Thompson Grayer. Corrected QTc at discharge 456 msec.  Discharge Vitals: Blood pressure 130/82, pulse 61, temperature 97.5 F (36.4 C), temperature source Oral, resp. rate 16, height 6' (1.829 m), weight 246 lb 14.8 oz (112.005 kg), SpO2 98.00%.   Labs: Lab Results  Component Value Date   WBC 8.4 09/18/2013   HGB  16.0 09/18/2013   HCT 46.5 09/18/2013   MCV 88.4 09/18/2013   PLT 227.0 09/18/2013     Recent Labs Lab 12/30/13 0556  NA 140  K 4.4  CL 102  CO2 23  BUN 20  CREATININE 0.90  CALCIUM 9.2  GLUCOSE 111*  Magnesium 2.1   Disposition:  The patient is being discharged in stable condition.  Follow-up:     Follow-up Information   Follow up with Aris Georgia, [place] On 01/08/2014. (10:00)    Specialty:  Pharmacist   Contact information:   [address] [place] [address] [phone]       Follow up with Kathee Delton, MD On 01/11/2014. (3:45)    Specialty:  Pulmonary Disease   Contact information:   [address] [address] [phone]       Follow up with Kirk Ruths, MD On 02/07/2014. (3:15)    Specialty:  Cardiology   Contact information:   [address] [place] [address] [phone]      Discharge Medications:    Medication List         apixaban 5 MG Tabs tablet  Commonly known as:  ELIQUIS  Take 1 tablet (5 mg total) by mouth 2 (two) times daily.     diltiazem 120 MG 24 hr capsule  Commonly known as:  CARDIZEM CD  Take 1 capsule (120 mg total) by mouth daily.     dofetilide 125 MCG capsule  Commonly known as:  TIKOSYN  Take 3 capsules (375 mcg total) by mouth every 12 (twelve) hours.     furosemide 20 MG tablet  Commonly  known as:  LASIX  Take 20 mg by mouth as needed for fluid.     metoprolol succinate 50 MG 24 hr tablet  Commonly known as:  TOPROL-XL  Take 75 mg by mouth 2 (two) times daily. Take with or immediately following a meal.       Duration of Discharge Encounter: Greater than 30 minutes including physician time.  Darrick Huntsman, PA-C 12/31/2013, 11:18 AM   Thompson Grayer MD

## 2013-12-31 NOTE — Progress Notes (Signed)
Per Dr. Rayann Heman, Larry Evans is stable for discharge home today if QTc is stable on post Tikosyn ECG this AM. Confirmed with Dr. Rayann Heman - no need for labs today.

## 2013-12-31 NOTE — Progress Notes (Signed)
DC orders received.  Patient stable with no S/S of distress.  Medication and discharge information reviewed with patient.  Patient DC home. [place]

## 2014-01-01 ENCOUNTER — Encounter (HOSPITAL_COMMUNITY): Payer: Self-pay | Admitting: Cardiovascular Disease

## 2014-01-02 ENCOUNTER — Other Ambulatory Visit: Payer: Self-pay

## 2014-01-02 DIAGNOSIS — I4891 Unspecified atrial fibrillation: Secondary | ICD-10-CM

## 2014-01-02 MED ORDER — METOPROLOL SUCCINATE ER 50 MG PO TB24: ORAL_TABLET | ORAL | Status: DC

## 2014-01-02 MED ORDER — APIXABAN 5 MG PO TABS
5.0000 mg | ORAL_TABLET | Freq: Two times a day (BID) | ORAL | Status: DC
Start: 2014-01-02 — End: 2014-06-04

## 2014-01-02 MED ORDER — DOFETILIDE 125 MCG PO CAPS: 375.0000 ug | ORAL_CAPSULE | Freq: Two times a day (BID) | ORAL | Status: DC

## 2014-01-08 ENCOUNTER — Ambulatory Visit (INDEPENDENT_AMBULATORY_CARE_PROVIDER_SITE_OTHER): Payer: Managed Care, Other (non HMO) | Admitting: Pharmacist

## 2014-01-08 VITALS — BP 120/78 | HR 54 | Wt 242.0 lb

## 2014-01-08 DIAGNOSIS — Z79899 Other long term (current) drug therapy: Secondary | ICD-10-CM

## 2014-01-08 DIAGNOSIS — I4891 Unspecified atrial fibrillation: Secondary | ICD-10-CM

## 2014-01-08 LAB — BASIC METABOLIC PANEL
BUN: 15 mg/dL (ref 6–23)
CHLORIDE: 104 meq/L (ref 96–112)
CO2: 30 mEq/L (ref 19–32)
Calcium: 9.2 mg/dL (ref 8.4–10.5)
Creatinine, Ser: 1 mg/dL (ref 0.4–1.5)
GFR: 87.4 mL/min (ref >60.00)
GLUCOSE: 105 mg/dL — AB (ref 70–99)
POTASSIUM: 4.1 meq/L (ref 3.5–5.1)
Sodium: 139 mEq/L (ref 135–145)

## 2014-01-08 LAB — MAGNESIUM: Magnesium: 2 mg/dL (ref 1.5–2.5)

## 2014-01-08 MED ORDER — METOPROLOL SUCCINATE ER 50 MG PO TB24: ORAL_TABLET | ORAL | Status: DC

## 2014-01-08 NOTE — Patient Instructions (Signed)
1.  Stay off diltiazem 2.  Reduce metoprolol to 25 mg twice daily. 3.  Keep appointment with Dr. Stanford Breed on 02/07/14

## 2014-01-08 NOTE — Assessment & Plan Note (Addendum)
Dr. Caryl Comes advised to have patient reduce Metoprolol succinate 75 mg bid down to 25 mg bid given bradycardia.  Patient has been feeling great since starting Tikosyn.  QTc stable since hospital discharge.  Labs reviewed, K-4.1, Mg 2.0 are stable since discharge.  Will continue current therapy and follow up with Dr. Stanford Breed as scheduled in 4 weeks.

## 2014-01-08 NOTE — Progress Notes (Signed)
Larry Evans is seen today for Tikosyn follow up.  He was admitted on 12/26/13 and Tikosyn was initiated at 500 mcg bid.  On 12/28/13 he underwent DCCV which was successful in restoring rhythm.  His QTc was prolonged and the dose was reduced to 375 mcg bid.  His QTc remained stable at the lower dosage.  Patient was on metoprolol 75 mg bid and diltiazem 120 mg at time on admission, and patient stopped the diltiazem when he went home, as he was told to stop taking this. He is still on metoprolol succinate 75 mg bid today. Telemetry in the hospital demonstrated sinus bradycardia without recurrent arrhythmia.  Patient was discharged home on 12/31/13 by Dr. Rayann Heman on Tikosyn 375 mcg bid.  QTc at time of discharge was 456 msec.  Since discharge, patient states he is feeling better.  He has more energy and has been tolerating Tikosyn well.  His HR at home has been in the mid 50's per patient.  He is no longer taking diltiazem, but is on metoprolol 75 mg bid.  He hasn't missed any doses of Tikosyn or Eliquis.  Cost of Tikosyn has not been an issue.    EKG reviewed by Dr. Caryl Comes.  Sinus bradycardia with ventricular rate of 44 bpm.  QTc 444 msec.    Current Outpatient Prescriptions  Medication Sig Dispense Refill  . apixaban (ELIQUIS) 5 MG TABS tablet Take 1 tablet (5 mg total) by mouth 2 (two) times daily.  180 tablet  3  . dofetilide (TIKOSYN) 125 MCG capsule Take 3 capsules (375 mcg total) by mouth every 12 (twelve) hours.  180 capsule  3  . furosemide (LASIX) 20 MG tablet Take 20 mg by mouth as needed for fluid.       . metoprolol succinate (TOPROL-XL) 50 MG 24 hr tablet Take 75 mg by mouth 2 (two) times daily. Take 1 and 1/2 tablets  by mouth 2 (two) times daily .(75 mg ) Take with or immediately following a meal.       No current facility-administered medications for this visit.   No Known Allergies

## 2014-01-11 ENCOUNTER — Encounter: Payer: Self-pay | Admitting: Pulmonary Disease

## 2014-01-11 ENCOUNTER — Ambulatory Visit (INDEPENDENT_AMBULATORY_CARE_PROVIDER_SITE_OTHER): Payer: Managed Care, Other (non HMO) | Admitting: Pulmonary Disease

## 2014-01-11 VITALS — BP 124/78 | HR 57 | Temp 97.5°F | Ht 72.0 in | Wt 247.2 lb

## 2014-01-11 DIAGNOSIS — G4733 Obstructive sleep apnea (adult) (pediatric): Secondary | ICD-10-CM

## 2014-01-11 NOTE — Patient Instructions (Signed)
Will send an order to your dme to set your machine on the auto setting. Work on weight loss Turn up heat on humidifier to get more moisture. Will see you back in 8mos, but call if having issues.

## 2014-01-11 NOTE — Assessment & Plan Note (Signed)
The patient has done well with CPAP since last visit, and his download shows an excellent compliance and fairly good control of his obstructive events. At this point, we'll change his machine to the auto setting, and I've also asked him to make changes to his humidification given his ongoing dryness. Finally, I have asked him to work aggressively on weight loss.

## 2014-01-11 NOTE — Progress Notes (Signed)
   Subjective:    Patient ID: Larry Evans, male    DOB: [birth year], 48 y.o.   MRN: [id number]  HPI The patient comes in today for followup of his obstructive sleep apnea. He is wearing CPAP compliantly by his download, but is still having a few breakthrough events as expected. We have yet to optimize his pressure.  He is having no issues with his mask fit, and has seen a definite improvement in his sleep and daytime alertness.   Review of Systems  Constitutional: Negative for fever and unexpected weight change.  HENT: Negative for congestion, dental problem, ear pain, nosebleeds, postnasal drip, rhinorrhea, sinus pressure, sneezing, sore throat and trouble swallowing.   Eyes: Negative for redness and itching.  Respiratory: Negative for cough, chest tightness, shortness of breath and wheezing.   Cardiovascular: Negative for palpitations and leg swelling.  Gastrointestinal: Negative for nausea and vomiting.  Genitourinary: Negative for dysuria.  Musculoskeletal: Negative for joint swelling.  Skin: Negative for rash.  Neurological: Negative for headaches.  Hematological: Does not bruise/bleed easily.  Psychiatric/Behavioral: Negative for dysphoric mood. The patient is not nervous/anxious.        Objective:   Physical Exam Overweight male in no acute distress Nose without purulence or discharge noted No skin breakdown or pressure necrosis from the CPAP mask Neck without lymphadenopathy or thyromegaly Lower extremities with mild edema, no cyanosis Alert and oriented, moves all 4 extremities       Assessment & Plan:

## 2014-02-06 ENCOUNTER — Encounter: Payer: Managed Care, Other (non HMO) | Admitting: Internal Medicine

## 2014-02-07 ENCOUNTER — Encounter: Payer: Self-pay | Admitting: Cardiology

## 2014-02-07 ENCOUNTER — Encounter: Payer: Self-pay | Admitting: *Deleted

## 2014-02-07 ENCOUNTER — Ambulatory Visit (INDEPENDENT_AMBULATORY_CARE_PROVIDER_SITE_OTHER): Payer: Managed Care, Other (non HMO) | Admitting: Cardiology

## 2014-02-07 ENCOUNTER — Encounter (INDEPENDENT_AMBULATORY_CARE_PROVIDER_SITE_OTHER): Payer: Self-pay

## 2014-02-07 VITALS — BP 124/82 | HR 51 | Ht 72.0 in | Wt 246.4 lb

## 2014-02-07 DIAGNOSIS — G4733 Obstructive sleep apnea (adult) (pediatric): Secondary | ICD-10-CM

## 2014-02-07 DIAGNOSIS — I428 Other cardiomyopathies: Secondary | ICD-10-CM

## 2014-02-07 DIAGNOSIS — I4891 Unspecified atrial fibrillation: Secondary | ICD-10-CM

## 2014-02-07 DIAGNOSIS — I420 Dilated cardiomyopathy: Secondary | ICD-10-CM

## 2014-02-07 DIAGNOSIS — I429 Cardiomyopathy, unspecified: Secondary | ICD-10-CM

## 2014-02-07 NOTE — Assessment & Plan Note (Addendum)
LV function and reduce previous echocardiogram felt possibly secondary to tachycardia versus alcohol. He is now in sinus rhythm. Repeat echocardiogram. Hopefully LV function will have normalized. If not we will plan a nuclear study to screen for ischemia and also add ACE inhibitor. Continue beta blocker. DC ETOH.

## 2014-02-07 NOTE — Assessment & Plan Note (Signed)
Patient remains in sinus rhythm. Continue tikosyn and apixaban.  His CHADS score is 1 for reduced LV function/CHF.

## 2014-02-07 NOTE — Patient Instructions (Signed)

## 2014-02-07 NOTE — Progress Notes (Signed)
      HPI: FU atrial fibrillation. Initially seen in December of 2014 for atrial fibrillation. TSH was normal. Echocardiogram in January of 2015 showed an ejection fraction of 35-40%. The aortic root was mildly dilated. There was moderate left atrial enlargement and mild to moderate right atrial enlargement. Patient had DCCV 10/05/13 but afib recurred; subsequently seen by Dr Rayann Heman and started on tikosyn and had repeat DCCV 4/24. Since then, the patient denies any dyspnea on exertion, orthopnea, PND, pedal edema, palpitations, syncope or chest pain.    Current Outpatient Prescriptions  Medication Sig Dispense Refill  . apixaban (ELIQUIS) 5 MG TABS tablet Take 1 tablet (5 mg total) by mouth 2 (two) times daily.  180 tablet  3  . dofetilide (TIKOSYN) 125 MCG capsule Take 3 capsules (375 mcg total) by mouth every 12 (twelve) hours.  180 capsule  3  . furosemide (LASIX) 20 MG tablet Take 20 mg by mouth as needed for fluid.       . metoprolol (LOPRESSOR) 50 MG tablet Take 50 mg by mouth 2 (two) times daily. Patient is cutting 50 mg tablet in half. Taking 25 mg in the morning and 25 mg in the evening.       No current facility-administered medications for this visit.     Past Medical History  Diagnosis Date  . Persistent atrial fibrillation     a. dx 08/2013 => Apixaban started 08/31/13  . Obstructive sleep apnea     uses CPAP  . Non-ischemic cardiomyopathy     Past Surgical History  Procedure Laterality Date  . Hernia repair    . Cardioversion N/A 10/05/2013    Procedure: CARDIOVERSION;  Surgeon: Fay Records, MD;  Location: [facility] ENDOSCOPY;  Service: Cardiovascular;  Laterality: N/A;  12:35       elective cardioversion, Propofol  60  mg, IV adminiter by Dr. Gwenlyn Fudge, anesthesia...synch 200 joules by Dr. Harrington Challenger.....converted to NSR....  . Cardioversion N/A 12/28/2013    Procedure: CARDIOVERSION BEDSIDE;  Surgeon: Thayer Headings, MD;  Location: [facility];  Service: Cardiovascular;  Laterality: N/A;     History   Social History  . Marital Status: Divorced    Spouse Name: N/A    Number of Children: 1  . Years of Education: N/A   Occupational History  .      Programmer, applications   Social History Main Topics  . Smoking status: Former Smoker -- 0.25 packs/day for 5 years    Types: Cigarettes    Quit date: 10/05/2012  . Smokeless tobacco: Never Used  . Alcohol Use: 1.2 oz/week    2 Cans of beer per week     Comment: heavy ETOH  . Drug Use: No  . Sexual Activity: Yes    Birth Control/ Protection: Condom   Other Topics Concern  . Not on file   Social History Narrative  . No narrative on file    ROS: no fevers or chills, productive cough, hemoptysis, dysphasia, odynophagia, melena, hematochezia, dysuria, hematuria, rash, seizure activity, orthopnea, PND, pedal edema, claudication. Remaining systems are negative.  Physical Exam: Well-developed well-nourished in no acute distress.  Skin is warm and dry.  HEENT is normal.  Neck is supple.  Chest is clear to auscultation with normal expansion.  Cardiovascular exam is regular rate and rhythm.  Abdominal exam nontender or distended. No masses palpated. Extremities show no edema. neuro grossly intact

## 2014-02-07 NOTE — Assessment & Plan Note (Signed)
Managed by pulmonary

## 2014-02-28 ENCOUNTER — Ambulatory Visit (HOSPITAL_COMMUNITY): Payer: Managed Care, Other (non HMO) | Attending: Internal Medicine | Admitting: Radiology

## 2014-02-28 DIAGNOSIS — I428 Other cardiomyopathies: Secondary | ICD-10-CM | POA: Insufficient documentation

## 2014-02-28 DIAGNOSIS — I429 Cardiomyopathy, unspecified: Secondary | ICD-10-CM

## 2014-02-28 NOTE — Progress Notes (Signed)
Echocardiogram performed.  

## 2014-05-02 ENCOUNTER — Other Ambulatory Visit: Payer: Self-pay

## 2014-05-02 MED ORDER — DOFETILIDE 125 MCG PO CAPS: 375.0000 ug | ORAL_CAPSULE | Freq: Two times a day (BID) | ORAL | Status: DC

## 2014-05-06 ENCOUNTER — Other Ambulatory Visit: Payer: Self-pay | Admitting: *Deleted

## 2014-05-06 MED ORDER — DOFETILIDE 125 MCG PO CAPS: 375.0000 ug | ORAL_CAPSULE | Freq: Two times a day (BID) | ORAL | Status: DC

## 2014-06-04 ENCOUNTER — Other Ambulatory Visit: Payer: Self-pay | Admitting: *Deleted

## 2014-06-04 MED ORDER — APIXABAN 5 MG PO TABS: 5.0000 mg | ORAL_TABLET | Freq: Two times a day (BID) | ORAL | Status: DC

## 2014-07-04 ENCOUNTER — Other Ambulatory Visit: Payer: Self-pay

## 2014-07-04 MED ORDER — DOFETILIDE 125 MCG PO CAPS: 375.0000 ug | ORAL_CAPSULE | Freq: Two times a day (BID) | ORAL | Status: DC

## 2014-07-15 ENCOUNTER — Ambulatory Visit (INDEPENDENT_AMBULATORY_CARE_PROVIDER_SITE_OTHER): Payer: Managed Care, Other (non HMO) | Admitting: Pulmonary Disease

## 2014-07-15 ENCOUNTER — Encounter: Payer: Self-pay | Admitting: Pulmonary Disease

## 2014-07-15 VITALS — BP 132/80 | HR 73 | Temp 98.0°F | Ht 72.0 in | Wt 259.0 lb

## 2014-07-15 DIAGNOSIS — G4733 Obstructive sleep apnea (adult) (pediatric): Secondary | ICD-10-CM

## 2014-07-15 NOTE — Patient Instructions (Signed)
You need to get back with your home care company to work on a better mask fit.  Would try something different. Keep working on weight loss Keep up with supplies and mask changes. followup with me again in one year, but call if having issues with cpap.

## 2014-07-15 NOTE — Assessment & Plan Note (Signed)
The patient is wearing C Pap compliantly, and his download shows good control of his AHI.  He feels that he is sleeping much better, with significant improvement in his daytime alertness. His only issue currently with the C Pap is a poorly fitting mask, and this is causing pressure issues to his nasolabial folds. I have asked him to work with his home care company on a better fitting mask. Finally, I have encouraged him to work aggressively on weight loss.

## 2014-07-15 NOTE — Progress Notes (Signed)
   Subjective:    Patient ID: Larry Evans, male    DOB: 1966/01/01, 48 y.o.   MRN: [id number]  HPI The patient comes in today for follow-up of his obstructive sleep apnea. He has been wearing C Pap compliantly by his download, but is having issues with his mask fit. He has seen significant improvement in his sleep and daytime alertness.   Review of Systems  Constitutional: Negative for fever and unexpected weight change.  HENT: Negative for congestion, dental problem, ear pain, nosebleeds, postnasal drip, rhinorrhea, sinus pressure, sneezing, sore throat and trouble swallowing.   Eyes: Negative for redness and itching.  Respiratory: Negative for cough, chest tightness, shortness of breath and wheezing.   Cardiovascular: Negative for palpitations and leg swelling.  Gastrointestinal: Negative for nausea and vomiting.  Genitourinary: Negative for dysuria.  Musculoskeletal: Negative for joint swelling.  Skin: Negative for rash.  Neurological: Negative for headaches.  Hematological: Does not bruise/bleed easily.  Psychiatric/Behavioral: Negative for dysphoric mood. The patient is not nervous/anxious.        Objective:   Physical Exam Overweight male in no acute distress Nose without purulence or discharge noted Mild excoriation involving his nasal labial folds from his C Pap mask, but no skin breakdown. Neck without lymphadenopathy or thyromegaly Lower extremities without edema, no cyanosis Alert and oriented, moves all 4 extremities.       Assessment & Plan:

## 2014-07-30 ENCOUNTER — Other Ambulatory Visit: Payer: Self-pay | Admitting: Cardiology

## 2014-08-19 ENCOUNTER — Other Ambulatory Visit: Payer: Self-pay | Admitting: Physician Assistant

## 2014-08-22 ENCOUNTER — Telehealth: Payer: Self-pay

## 2014-08-23 ENCOUNTER — Other Ambulatory Visit: Payer: Self-pay

## 2014-08-23 NOTE — Telephone Encounter (Signed)
Colchicine needs to come from primary care Larry Evans

## 2014-08-23 NOTE — Telephone Encounter (Signed)
Talked to patient to let him know that Dr Stanford Breed would not fill  Colchicine. I told him to call his PCP

## 2014-09-02 ENCOUNTER — Other Ambulatory Visit: Payer: Self-pay | Admitting: Physician Assistant

## 2014-09-19 ENCOUNTER — Telehealth: Payer: Self-pay | Admitting: Cardiology

## 2014-09-19 NOTE — Telephone Encounter (Signed)
Left message and fax number for Larry Evans. We have not received anything.

## 2014-09-19 NOTE — Telephone Encounter (Signed)
Roxane called in stating that a questionaire for pt's Afib was faxed to the office on 1/2 and would like to know if Dr. Stanford Breed received it. Please call  thanks

## 2014-09-25 ENCOUNTER — Telehealth: Payer: Self-pay | Admitting: Cardiology

## 2014-09-25 NOTE — Telephone Encounter (Signed)
Reference#-[id number] Did Dr Stanford Breed receive questionnaire that was faxed over on 09-20-14? Would like to know the status of this questionnaire.

## 2014-09-25 NOTE — Telephone Encounter (Signed)
Spoke with pt, he has given me the okay to send his records to [facility]. Left message for roxanne, will fax on Friday when back in the [place] office.

## 2014-09-30 NOTE — Telephone Encounter (Signed)
Paperwork and records faxed to [phone].

## 2014-10-24 NOTE — Progress Notes (Signed)
HPI: FU atrial fibrillation. Initially seen in December of 2014 for atrial fibrillation. TSH was normal. Echocardiogram in January of 2015 showed an ejection fraction of 35-40%. The aortic root was mildly dilated. There was moderate left atrial enlargement and mild to moderate right atrial enlargement. Patient had DCCV 10/05/13 but afib recurred; subsequently seen by Dr Rayann Heman and started on tikosyn and had repeat DCCV 4/24. Echo repeated 6/15 and showed normal LV function. Since last seen, he has had one episode of atrial fibrillation. Otherwise no dyspnea on exertion, orthopnea, pedal edema, syncope or chest pain. No bleeding. He is describing depression.  Current Outpatient Prescriptions  Medication Sig Dispense Refill  . colchicine (COLCRYS) 0.6 MG tablet Take 1 tablet (0.6 mg total) by mouth daily. 22 tablet 0  . dofetilide (TIKOSYN) 125 MCG capsule Take 3 capsules (375 mcg total) by mouth every 12 (twelve) hours. 270 capsule 3  . ELIQUIS 5 MG TABS tablet TAKE 1 TABLET (5 MG TOTAL) BY MOUTH 2 (TWO) TIMES DAILY. 60 tablet 5  . furosemide (LASIX) 20 MG tablet Take 20 mg by mouth as needed for fluid.     . metoprolol (LOPRESSOR) 50 MG tablet Take 25 mg by mouth 2 (two) times daily. Patient is cutting 50 mg tablet in half. Taking 25 mg in the morning and 25 mg in the evening.     No current facility-administered medications for this visit.     Past Medical History  Diagnosis Date  . Persistent atrial fibrillation     a. dx 08/2013 => Apixaban started 08/31/13  . Obstructive sleep apnea     uses CPAP  . Non-ischemic cardiomyopathy     Past Surgical History  Procedure Laterality Date  . Hernia repair    . Cardioversion N/A 10/05/2013    Procedure: CARDIOVERSION;  Surgeon: Fay Records, MD;  Location: [facility] ENDOSCOPY;  Service: Cardiovascular;  Laterality: N/A;  12:35       elective cardioversion, Propofol  60  mg, IV adminiter by Dr. Gwenlyn Fudge, anesthesia...synch 200 joules by Dr.  Harrington Challenger.....converted to NSR....  . Cardioversion N/A 12/28/2013    Procedure: CARDIOVERSION BEDSIDE;  Surgeon: Thayer Headings, MD;  Location: [facility];  Service: Cardiovascular;  Laterality: N/A;    History   Social History  . Marital Status: Divorced    Spouse Name: N/A  . Number of Children: 1  . Years of Education: N/A   Occupational History  .      Programmer, applications   Social History Main Topics  . Smoking status: Former Smoker -- 0.25 packs/day for 5 years    Types: Cigarettes    Quit date: 10/05/2012  . Smokeless tobacco: Never Used  . Alcohol Use: 1.2 oz/week    2 Cans of beer per week     Comment: heavy ETOH  . Drug Use: No  . Sexual Activity: Yes    Birth Control/ Protection: Condom   Other Topics Concern  . Not on file   Social History Narrative    ROS: no fevers or chills, productive cough, hemoptysis, dysphasia, odynophagia, melena, hematochezia, dysuria, hematuria, rash, seizure activity, orthopnea, PND, pedal edema, claudication. Remaining systems are negative.  Physical Exam: Well-developed well-nourished in no acute distress.  Skin is warm and dry.  HEENT is normal.  Neck is supple.  Chest is clear to auscultation with normal expansion.  Cardiovascular exam is regular rate and rhythm.  Abdominal exam nontender or distended. No masses palpated. Extremities show no  edema. neuro grossly intact  ECG sinus rhythm at a rate of 58. No ST changes.

## 2014-10-25 ENCOUNTER — Encounter: Payer: Self-pay | Admitting: Cardiology

## 2014-10-25 ENCOUNTER — Telehealth: Payer: Self-pay | Admitting: Cardiology

## 2014-10-25 ENCOUNTER — Ambulatory Visit (INDEPENDENT_AMBULATORY_CARE_PROVIDER_SITE_OTHER): Payer: Managed Care, Other (non HMO) | Admitting: Cardiology

## 2014-10-25 VITALS — BP 110/60 | HR 58 | Ht 72.0 in | Wt 254.9 lb

## 2014-10-25 DIAGNOSIS — I480 Paroxysmal atrial fibrillation: Secondary | ICD-10-CM

## 2014-10-25 LAB — BASIC METABOLIC PANEL WITH GFR
BUN: 17 mg/dL (ref 6–23)
CO2: 27 mEq/L (ref 19–32)
CREATININE: 0.93 mg/dL (ref 0.50–1.35)
Calcium: 9.7 mg/dL (ref 8.4–10.5)
Chloride: 103 mEq/L (ref 96–112)
GFR, Est Non African American: >89 mL/min
GLUCOSE: 110 mg/dL — AB (ref 70–99)
Potassium: 4.6 mEq/L (ref 3.5–5.3)
Sodium: 140 mEq/L (ref 135–145)

## 2014-10-25 LAB — CBC
HCT: 46.6 % (ref 39.0–52.0)
HEMOGLOBIN: 15.9 g/dL (ref 13.0–17.0)
MCH: 29.2 pg (ref 26.0–34.0)
MCHC: 34.1 g/dL (ref 30.0–36.0)
MCV: 85.7 fL (ref 78.0–100.0)
MPV: 8.9 fL (ref 8.6–12.4)
Platelets: 218 10*3/uL (ref 150–400)
RBC: 5.44 MIL/uL (ref 4.22–5.81)
RDW: 13.2 % (ref 11.5–15.5)
WBC: 5.6 10*3/uL (ref 4.0–10.5)

## 2014-10-25 LAB — MAGNESIUM: MAGNESIUM: 2.1 mg/dL (ref 1.5–2.5)

## 2014-10-25 MED ORDER — DILTIAZEM HCL ER COATED BEADS 120 MG PO CP24: 120.0000 mg | ORAL_CAPSULE | Freq: Every day | ORAL | Status: DC

## 2014-10-25 NOTE — Patient Instructions (Signed)
Your physician wants you to follow-up in: [place] will receive a reminder letter in the mail two months in advance. If you don't receive a letter, please call our office to schedule the follow-up appointment.   STOP METOPROLOL  START CARDIZEM CD 120 MG ONCE DAILY  Your physician recommends that you HAVE LAB WORK TODAY

## 2014-10-25 NOTE — Assessment & Plan Note (Signed)
Patient remains in sinus rhythm. He is describing some feelings of depression. This may be related to beta blockade. Discontinue metoprolol and begin Cardizem CD 120 mg daily. Continue tikosyn and apixaban. Check hemoglobin, renal function and magnesium.

## 2014-10-25 NOTE — Assessment & Plan Note (Signed)
Patient's LV function improved on most recent echocardiogram. Previous reduction possibly secondary to atrial fibrillation/tachycardia. This has now been corrected.

## 2014-10-25 NOTE — Telephone Encounter (Signed)
Left message for pt to call.

## 2014-10-25 NOTE — Telephone Encounter (Signed)
Pt was here to see Dr Stanford Breed this morning. He said he needs to talk to you.

## 2014-10-29 ENCOUNTER — Encounter: Payer: Self-pay | Admitting: *Deleted

## 2014-10-29 NOTE — Telephone Encounter (Signed)
Spoke with pt, letter for insurance generated and mailed to patient home address.

## 2015-01-01 ENCOUNTER — Other Ambulatory Visit: Payer: Self-pay

## 2015-01-01 MED ORDER — DOFETILIDE 125 MCG PO CAPS: 375.0000 ug | ORAL_CAPSULE | Freq: Two times a day (BID) | ORAL | Status: DC

## 2015-01-28 ENCOUNTER — Other Ambulatory Visit: Payer: Self-pay | Admitting: Cardiology

## 2015-07-03 ENCOUNTER — Other Ambulatory Visit: Payer: Self-pay | Admitting: Cardiology

## 2015-07-17 ENCOUNTER — Telehealth: Payer: Self-pay | Admitting: Cardiology

## 2015-07-17 NOTE — Telephone Encounter (Signed)
Pt did not need to leave a message. °

## 2015-07-17 NOTE — Telephone Encounter (Signed)
When does pt need his next appt?

## 2015-07-18 ENCOUNTER — Other Ambulatory Visit: Payer: Self-pay

## 2015-07-18 MED ORDER — APIXABAN 5 MG PO TABS: ORAL_TABLET | ORAL | Status: DC

## 2015-07-18 NOTE — Telephone Encounter (Signed)
6 months from February, so next available

## 2015-07-29 NOTE — Progress Notes (Signed)
HPI: FU atrial fibrillation. Initially seen in December of 2014 for atrial fibrillation. TSH was normal. Echocardiogram in January of 2015 showed an ejection fraction of 35-40%. The aortic root was mildly dilated. There was moderate left atrial enlargement and mild to moderate right atrial enlargement. Patient had DCCV 10/05/13 but afib recurred; subsequently seen by Dr Rayann Heman and started on tikosyn and had repeat DCCV 4/24. Echo repeated 6/15 and showed normal LV function. Since last seen, There is no dyspnea on exertion, orthopnea, PND, pedal edema, syncope, bleeding or chest pain. He states he occasionally goes into atrial fibrillation after eating. Symptoms last 1-2 hours and resolve spontaneously. He also states Cardizem made him go into atrial fibrillation.  Current Outpatient Prescriptions  Medication Sig Dispense Refill  . apixaban (ELIQUIS) 5 MG TABS tablet TAKE 1 TABLET (5 MG TOTAL) BY MOUTH 2 (TWO) TIMES DAILY. 60 tablet 5  . TIKOSYN 125 MCG capsule TAKE 3 CAPSULES (375 MCG TOTAL) BY MOUTH EVERY 12 (TWELVE) HOURS. 270 capsule 0  . colchicine (COLCRYS) 0.6 MG tablet Take 1 tablet (0.6 mg total) by mouth daily. (Patient not taking: Reported on 08/05/2015) 22 tablet 0  . furosemide (LASIX) 20 MG tablet Take 20 mg by mouth as needed for fluid.      No current facility-administered medications for this visit.     Past Medical History  Diagnosis Date  . Persistent atrial fibrillation ([place])     a. dx 08/2013 => Apixaban started 08/31/13  . Obstructive sleep apnea     uses CPAP  . Non-ischemic cardiomyopathy [facility])     Past Surgical History  Procedure Laterality Date  . Hernia repair    . Cardioversion N/A 10/05/2013    Procedure: CARDIOVERSION;  Surgeon: Fay Records, MD;  Location: [facility] ENDOSCOPY;  Service: Cardiovascular;  Laterality: N/A;  12:35       elective cardioversion, Propofol  60  mg, IV adminiter by Dr. Gwenlyn Fudge, anesthesia...synch 200 joules by Dr. Harrington Challenger.....converted  to NSR....  . Cardioversion N/A 12/28/2013    Procedure: CARDIOVERSION BEDSIDE;  Surgeon: Thayer Headings, MD;  Location: [facility] OR;  Service: Cardiovascular;  Laterality: N/A;    Social History   Social History  . Marital Status: Divorced    Spouse Name: N/A  . Number of Children: 1  . Years of Education: N/A   Occupational History  .      Programmer, applications   Social History Main Topics  . Smoking status: Former Smoker -- 0.25 packs/day for 5 years    Types: Cigarettes    Quit date: 10/05/2012  . Smokeless tobacco: Never Used  . Alcohol Use: 1.2 oz/week    2 Cans of beer per week     Comment: heavy ETOH  . Drug Use: No  . Sexual Activity: Yes    Birth Control/ Protection: Condom   Other Topics Concern  . Not on file   Social History Narrative    ROS: no fevers or chills, productive cough, hemoptysis, dysphasia, odynophagia, melena, hematochezia, dysuria, hematuria, rash, seizure activity, orthopnea, PND, pedal edema, claudication. Remaining systems are negative.  Physical Exam: Well-developed well-nourished in no acute distress.  Skin is warm and dry.  HEENT is normal.  Neck is supple.  Chest is clear to auscultation with normal expansion.  Cardiovascular exam is regular rate and rhythm.  Abdominal exam nontender or distended. No masses palpated. Extremities show no edema. neuro grossly intact  ECG Sinus rhythm at a rate of 60.  Nonspecific ST changes. Prolonged QT interval.

## 2015-08-05 ENCOUNTER — Ambulatory Visit (INDEPENDENT_AMBULATORY_CARE_PROVIDER_SITE_OTHER): Payer: Managed Care, Other (non HMO) | Admitting: Cardiology

## 2015-08-05 ENCOUNTER — Encounter: Payer: Self-pay | Admitting: Cardiology

## 2015-08-05 VITALS — BP 124/76 | HR 60 | Ht 72.0 in | Wt 252.3 lb

## 2015-08-05 DIAGNOSIS — I420 Dilated cardiomyopathy: Secondary | ICD-10-CM

## 2015-08-05 DIAGNOSIS — I480 Paroxysmal atrial fibrillation: Secondary | ICD-10-CM

## 2015-08-05 LAB — MAGNESIUM: MAGNESIUM: 2.3 mg/dL (ref 1.5–2.5)

## 2015-08-05 LAB — CBC
HEMATOCRIT: 45 % (ref 39.0–52.0)
Hemoglobin: 16.2 g/dL (ref 13.0–17.0)
MCH: 31 pg (ref 26.0–34.0)
MCHC: 36 g/dL (ref 30.0–36.0)
MCV: 86 fL (ref 78.0–100.0)
MPV: 9.1 fL (ref 8.6–12.4)
PLATELETS: 191 10*3/uL (ref 150–400)
RBC: 5.23 MIL/uL (ref 4.22–5.81)
RDW: 13 % (ref 11.5–15.5)
WBC: 6.8 10*3/uL (ref 4.0–10.5)

## 2015-08-05 LAB — BASIC METABOLIC PANEL
BUN: 18 mg/dL (ref 7–25)
CALCIUM: 9.4 mg/dL (ref 8.6–10.3)
CO2: 26 mmol/L (ref 20–31)
Chloride: 101 mmol/L (ref 98–110)
Creat: 1.09 mg/dL (ref 0.70–1.33)
GLUCOSE: 108 mg/dL — AB (ref 65–99)
Potassium: 4.9 mmol/L (ref 3.5–5.3)
SODIUM: 137 mmol/L (ref 135–146)

## 2015-08-05 NOTE — Patient Instructions (Signed)

## 2015-08-05 NOTE — Assessment & Plan Note (Signed)
Improved on most recent echocardiogram. 

## 2015-08-05 NOTE — Assessment & Plan Note (Signed)
Patient in sinus rhythm today. Continue tikosyn and apixaban (CHADSvasc 1 for previous CHF). Check hemoglobin, renal function and magnesium. Patient states Cardizem caused increased episodes of atrial fibrillation previously. We will continue off of this medication for now. If he has more frequent episodes in the future despite tikosyn we can consider referral for ablation.

## 2015-08-07 ENCOUNTER — Other Ambulatory Visit: Payer: Self-pay | Admitting: Cardiology

## 2015-08-07 MED ORDER — DOFETILIDE 125 MCG PO CAPS: 375.0000 ug | ORAL_CAPSULE | Freq: Two times a day (BID) | ORAL | Status: DC

## 2015-08-11 ENCOUNTER — Other Ambulatory Visit: Payer: Self-pay

## 2015-08-11 NOTE — Telephone Encounter (Signed)
Medication Detail      Disp Refills Start End     dofetilide (TIKOSYN) 125 MCG capsule 270 capsule 0 08/07/2015     Sig - Route: Take 3 capsules (375 mcg total) by mouth every 12 (twelve) hours. - Oral    E-Prescribing Status: Receipt confirmed by pharmacy (08/07/2015 3:18 PM EST)     Pharmacy    [id number] - [place], [place]

## 2015-08-26 ENCOUNTER — Ambulatory Visit: Payer: Managed Care, Other (non HMO) | Admitting: Internal Medicine

## 2015-09-11 ENCOUNTER — Telehealth: Payer: Self-pay | Admitting: Cardiology

## 2015-09-11 NOTE — Telephone Encounter (Signed)
Please call,wants to talk to you about reducing his Tikosyn.

## 2015-09-11 NOTE — Telephone Encounter (Signed)
Spoke with pt, his insurance has changed and he can no longer afford the tikosyn. He is currently taking 125 mg 3 tablets twice daily. He would like to decrease the dose to 250 mg bid as he can afford this. He has taken the 250 mg dose for the last several days and reports his heart rate is good. Will forward to dr allred for his advise.

## 2015-09-12 MED ORDER — DOFETILIDE 250 MCG PO CAPS: 250.0000 ug | ORAL_CAPSULE | Freq: Two times a day (BID) | ORAL | Status: DC

## 2015-09-12 NOTE — Telephone Encounter (Signed)
OK to take tikosyn 250 mcg BID Would repeat ekg in 1 week

## 2015-09-12 NOTE — Telephone Encounter (Signed)
Spoke with pt, aware of dr allred's recommendations. He does not want to have an EKG, he knows when he goes into atrial fib and will call if he has a problem. Patient voiced understanding that is what dr allred reccommended. New script sent to pharmacy

## 2015-11-28 ENCOUNTER — Telehealth: Payer: Self-pay | Admitting: Cardiology

## 2015-11-28 NOTE — Telephone Encounter (Signed)
Per dr Zadie Rhine, the pt has stopped his eliquis and tikosyn due to cost. Will call the pt to schedule f/u appt. Follow up scheduled

## 2015-12-01 NOTE — Progress Notes (Signed)
HPI: FU atrial fibrillation. Initially seen in December of 2014 for atrial fibrillation. TSH was normal. Echocardiogram in January of 2015 showed an ejection fraction of 35-40%. The aortic root was mildly dilated. There was moderate left atrial enlargement and mild to moderate right atrial enlargement. Patient had DCCV 10/05/13 but afib recurred; subsequently seen by Dr Rayann Heman and started on tikosyn and had repeat DCCV 4/24. Echo repeated 6/15 and showed normal LV function. Contacted recently by primary care and was told patient discontinued his tikosyn and apixaban on his own. Since last seen,   Current Outpatient Prescriptions  Medication Sig Dispense Refill  . apixaban (ELIQUIS) 5 MG TABS tablet TAKE 1 TABLET (5 MG TOTAL) BY MOUTH 2 (TWO) TIMES DAILY. 60 tablet 5  . colchicine (COLCRYS) 0.6 MG tablet Take 1 tablet (0.6 mg total) by mouth daily. (Patient not taking: Reported on 08/05/2015) 22 tablet 0  . dofetilide (TIKOSYN) 250 MCG capsule Take 1 capsule (250 mcg total) by mouth every 12 (twelve) hours. 60 capsule 6  . furosemide (LASIX) 20 MG tablet Take 20 mg by mouth as needed for fluid.      No current facility-administered medications for this visit.     Past Medical History  Diagnosis Date  . Persistent atrial fibrillation ([place])     a. dx 08/2013 => Apixaban started 08/31/13  . Obstructive sleep apnea     uses CPAP  . Non-ischemic cardiomyopathy [facility])     Past Surgical History  Procedure Laterality Date  . Hernia repair    . Cardioversion N/A 10/05/2013    Procedure: CARDIOVERSION;  Surgeon: Fay Records, MD;  Location: [facility] ENDOSCOPY;  Service: Cardiovascular;  Laterality: N/A;  12:35       elective cardioversion, Propofol  60  mg, IV adminiter by Dr. Gwenlyn Fudge, anesthesia...synch 200 joules by Dr. Harrington Challenger.....converted to NSR....  . Cardioversion N/A 12/28/2013    Procedure: CARDIOVERSION BEDSIDE;  Surgeon: Thayer Headings, MD;  Location: [facility] OR;  Service: Cardiovascular;   Laterality: N/A;    Social History   Social History  . Marital Status: Divorced    Spouse Name: N/A  . Number of Children: 1  . Years of Education: N/A   Occupational History  .      Programmer, applications   Social History Main Topics  . Smoking status: Former Smoker -- 0.25 packs/day for 5 years    Types: Cigarettes    Quit date: 10/05/2012  . Smokeless tobacco: Never Used  . Alcohol Use: 1.2 oz/week    2 Cans of beer per week     Comment: heavy ETOH  . Drug Use: No  . Sexual Activity: Yes    Birth Control/ Protection: Condom   Other Topics Concern  . Not on file   Social History Narrative    Family History  Problem Relation Age of Onset  . Heart disease      No family history  . Lung cancer Maternal Grandmother   . Throat cancer Maternal Grandfather     ROS: no fevers or chills, productive cough, hemoptysis, dysphasia, odynophagia, melena, hematochezia, dysuria, hematuria, rash, seizure activity, orthopnea, PND, pedal edema, claudication. Remaining systems are negative.  Physical Exam: Well-developed well-nourished in no acute distress.  Skin is warm and dry.  HEENT is normal.  Neck is supple.  Chest is clear to auscultation with normal expansion.  Cardiovascular exam is regular rate and rhythm.  Abdominal exam nontender or distended. No masses palpated. Extremities show  no edema. neuro grossly intact  ECG     This encounter was created in error - please disregard.

## 2015-12-04 ENCOUNTER — Encounter: Payer: Managed Care, Other (non HMO) | Admitting: Cardiology

## 2016-07-27 ENCOUNTER — Other Ambulatory Visit (INDEPENDENT_AMBULATORY_CARE_PROVIDER_SITE_OTHER): Payer: Managed Care, Other (non HMO)

## 2016-07-27 ENCOUNTER — Encounter: Payer: Self-pay | Admitting: Nurse Practitioner

## 2016-07-27 ENCOUNTER — Encounter: Payer: Self-pay | Admitting: Internal Medicine

## 2016-07-27 ENCOUNTER — Ambulatory Visit (INDEPENDENT_AMBULATORY_CARE_PROVIDER_SITE_OTHER)
Admission: RE | Admit: 2016-07-27 | Discharge: 2016-07-27 | Disposition: A | Discharge status: Home / Self Care | Payer: Managed Care, Other (non HMO) | Source: Ambulatory Visit | Attending: Internal Medicine | Admitting: Internal Medicine

## 2016-07-27 ENCOUNTER — Telehealth: Payer: Self-pay | Admitting: Cardiology

## 2016-07-27 ENCOUNTER — Ambulatory Visit (INDEPENDENT_AMBULATORY_CARE_PROVIDER_SITE_OTHER): Payer: Managed Care, Other (non HMO) | Admitting: Internal Medicine

## 2016-07-27 DIAGNOSIS — G4733 Obstructive sleep apnea (adult) (pediatric): Secondary | ICD-10-CM | POA: Diagnosis not present

## 2016-07-27 DIAGNOSIS — J90 Pleural effusion, not elsewhere classified: Secondary | ICD-10-CM

## 2016-07-27 DIAGNOSIS — R0602 Shortness of breath: Secondary | ICD-10-CM

## 2016-07-27 DIAGNOSIS — I4891 Unspecified atrial fibrillation: Secondary | ICD-10-CM | POA: Diagnosis not present

## 2016-07-27 LAB — CBC WITH DIFFERENTIAL/PLATELET
BASOS PCT: 1 % (ref 0.0–3.0)
Basophils Absolute: 0.1 10*3/uL (ref 0.0–0.1)
EOS PCT: 1.2 % (ref 0.0–5.0)
Eosinophils Absolute: 0.1 10*3/uL (ref 0.0–0.7)
HCT: 51.6 % (ref 39.0–52.0)
Hemoglobin: 17.4 g/dL — ABNORMAL HIGH (ref 13.0–17.0)
LYMPHS ABS: 2.1 10*3/uL (ref 0.7–4.0)
Lymphocytes Relative: 24.6 % (ref 12.0–46.0)
MCHC: 33.7 g/dL (ref 30.0–36.0)
MCV: 90.2 fl (ref 78.0–100.0)
MONO ABS: 0.8 10*3/uL (ref 0.1–1.0)
MONOS PCT: 8.9 % (ref 3.0–12.0)
NEUTROS ABS: 5.6 10*3/uL (ref 1.4–7.7)
Neutrophils Relative %: 64.3 % (ref 43.0–77.0)
PLATELETS: 223 10*3/uL (ref 150.0–400.0)
RBC: 5.73 Mil/uL (ref 4.22–5.81)
RDW: 13.9 % (ref 11.5–15.5)
WBC: 8.6 10*3/uL (ref 4.0–10.5)

## 2016-07-27 LAB — BASIC METABOLIC PANEL
BUN: 20 mg/dL (ref 6–23)
CHLORIDE: 102 meq/L (ref 96–112)
CO2: 31 meq/L (ref 19–32)
CREATININE: 1.43 mg/dL (ref 0.40–1.50)
Calcium: 9.7 mg/dL (ref 8.4–10.5)
GFR: 55.27 mL/min — ABNORMAL LOW (ref >60.00)
GLUCOSE: 96 mg/dL (ref 70–99)
Potassium: 3.9 mEq/L (ref 3.5–5.1)
Sodium: 142 mEq/L (ref 135–145)

## 2016-07-27 LAB — TSH: TSH: 2.64 u[IU]/mL (ref 0.35–4.50)

## 2016-07-27 LAB — BRAIN NATRIURETIC PEPTIDE: PRO B NATRI PEPTIDE: 934 pg/mL — AB (ref 0.0–100.0)

## 2016-07-27 MED ORDER — FUROSEMIDE 20 MG PO TABS: 20.0000 mg | ORAL_TABLET | ORAL | 0 refills | Status: DC | PRN

## 2016-07-27 NOTE — Telephone Encounter (Signed)
New message  Pt c/o swelling: STAT is pt has developed SOB within 24 hours  1. Where is the swelling located? Stomach and chest   2.  Are you currently taking a "fluid pill"? Per pt needs a refill   3.  Are you currently SOB? Per pt some sob when laying down  Pt appt set for 11/22 with C. Sharolyn Douglas

## 2016-07-27 NOTE — Progress Notes (Signed)
Subjective:    Patient ID: Larry Evans, male    DOB: [birth year],    MRN: [id number]  HPI  29 yowm light smoker quit 2014 at wt 230 with no breathing problems but summer of 2016 noted indolent onset of doe with w/u by Dr Stanford Breed c/w RAF and referred to pulmonary clinic 07/27/2016 by Dr Stanford Breed for eval of worsening sob x summer 2017         07/27/2016 1st [facility] office visit/ Wert   Chief Complaint  Patient presents with  . Pulmonary Consult    Former [facility] patient: Pt c/o chest discomfort with trouble breathing. Pt states that his chest under his ribs is very tight. Pt states that he has been coughing up a little pink tinged mucus. Pt has not had any recent CXR.  Pt states that he can feel his AFib "flipping in and out" with pressure and pain in torso. Pt current HR is 114-125bpm  intially noted doe correlating with afib then onset x one month doe even when ifeels he's in NSR/ assc min cough with traces pink sputum  New problem also of immediate sob when supine not corrected with cpap  Was on eliquis until March 2017 but stopped p L leg injury. Has not tried saba/ prev w/u here for osa    No obvious patterns in day to day or daytime variability or assoc excess/ purulent sputum or mucus plugs or rgoss  hemoptysis or cp or   subjective wheeze or overt sinus or hb symptoms. No unusual exp hx or h/o childhood pna/ asthma or knowledge of premature birth.    Also denies any obvious fluctuation of symptoms with weather or environmental changes or other aggravating or alleviating factors except as outlined above   Current Medications, Allergies, Complete Past Medical History, Past Surgical History, Family History, and Social History were reviewed in [employer] record.         Review of Systems  Constitutional: Positive for appetite change. Negative for fever and unexpected weight change.  HENT: Negative for congestion, dental problem, ear pain,  nosebleeds, postnasal drip, rhinorrhea, sinus pressure, sneezing, sore throat and trouble swallowing.   Eyes: Negative for redness and itching.  Respiratory: Positive for cough and shortness of breath. Negative for chest tightness and wheezing.   Cardiovascular: Positive for palpitations ( irregular heartbeats). Negative for leg swelling.  Gastrointestinal: Negative for nausea and vomiting.  Genitourinary: Negative for dysuria.  Musculoskeletal: Positive for joint swelling.  Skin: Negative for rash.  Neurological: Negative for headaches.  Hematological: Does not bruise/bleed easily.  Psychiatric/Behavioral: Negative for dysphoric mood. The patient is not nervous/anxious.        Objective:   Physical Exam  amb obese wm nad    Wt Readings from Last 3 Encounters:  07/27/16 257 lb 12.8 oz (116.9 kg)  08/05/15 252 lb 4.8 oz (114.4 kg)  10/25/14 254 lb 14.4 oz (115.6 kg)    Vital signs reviewed  - Note on arrival 02 sats  92% on RA and BP 134/92 with Pulse 114      HEENT: nl dentition, turbinates, and oropharynx. Nl external ear canals without cough reflex   NECK :  without JVD/Nodes/TM/ nl carotid upstrokes bilaterally   LUNGS: no acc muscle use,  Nl contour chest which is clear to A   bilaterally without cough on insp or exp maneuvers - min dullness both bases    CV:  IRIR   no s3 or murmur  or increase in P2, no edema   ABD:  soft and nontender with nl inspiratory excursion in the supine position. No bruits or organomegaly, bowel sounds nl  MS:  Nl gait/ ext warm without deformities, calf tenderness, cyanosis or clubbing No obvious joint restrictions   SKIN: warm and dry with severe scarring LL ext.calf area  with mild diffuse chronic appearing erythema  NEURO:  alert, approp, nl sensorium with  no motor deficits     CXR PA and Lateral:   07/27/2016 :    I personally reviewed images and agree with radiology impression as follows:    CM with bilateral small pleural  effusions  Labs ordered/ reviewed:      Chemistry      Component Value Date/Time   NA 142 07/27/2016 1628   K 3.9 07/27/2016 1628   CL 102 07/27/2016 1628   CO2 31 07/27/2016 1628   BUN 20 07/27/2016 1628   CREATININE 1.43 07/27/2016 1628   CREATININE 1.09 08/05/2015 0929      Component Value Date/Time   CALCIUM 9.7 07/27/2016 1628   ALKPHOS 59 08/31/2013 1337   AST 20 08/31/2013 1337   ALT 30 08/31/2013 1337   BILITOT 0.9 08/31/2013 1337        Lab Results  Component Value Date   WBC 8.6 07/27/2016   HGB 17.4 (H) 07/27/2016   HCT 51.6 07/27/2016   MCV 90.2 07/27/2016   PLT 223.0 07/27/2016       Lab Results  Component Value Date   TSH 2.64 07/27/2016     Lab Results  Component Value Date   PROBNP 934.0 (H) 07/27/2016              Assessment & Plan:

## 2016-07-27 NOTE — Patient Instructions (Addendum)
Please remember to go to the lab and x-ray department downstairs for your tests - we will call you with the results when they are available.    Start eliquis 10 mg twice daily x 7 days then 5 mg twice daily (that's the dose you need to be on anyway as a maintenance dose due to atrial fibrillation unless/ /until Dr Stanford Breed takes you off it

## 2016-07-27 NOTE — Telephone Encounter (Signed)
I returned call. Patient notes he's been experiencing shortness of breath and "fullness" in his chest for "a while". He notes he hasn't been seen in about a year, and states he would be better at following up at appointments, but has had issues with his health insurance.  He takes furosemide PRN and requested a refill of this. He wants to take a pill today to see how he feels and whether this improves his symptoms. He also had scheduled w [place] to see APP tomorrow, and has an appt this afternoon with Dr. Gustavus Bryant office. I have encouraged patient to go to the ER if he feels his dyspnea symptoms are worse, or if he is experiencing chest pain.

## 2016-07-28 ENCOUNTER — Ambulatory Visit: Payer: Managed Care, Other (non HMO) | Admitting: Nurse Practitioner

## 2016-07-28 ENCOUNTER — Telehealth: Payer: Self-pay | Admitting: Internal Medicine

## 2016-07-28 DIAGNOSIS — J90 Pleural effusion, not elsewhere classified: Secondary | ICD-10-CM | POA: Insufficient documentation

## 2016-07-28 LAB — D-DIMER, QUANTITATIVE: D-Dimer, Quant: 0.73 mcg/mL FEU — ABNORMAL HIGH (ref <0.50)

## 2016-07-28 NOTE — Telephone Encounter (Signed)
Notes Recorded by Tanda Rockers, MD on 07/28/2016 at 11:36 AM EST Call pt: Reviewed cxr and labs and they are c/w elevated heart pressures so f/u with cards is appropriate  Details       Notes Recorded by Tanda Rockers, MD on 07/28/2016 at 11:36 AM EST Call patient : Study is unremarkable, no change in recs ( borderline up but not suggestive of significant clot burden so eliquis should cover fine both now and for possible future events)   Pt aware of results and will contact cardiology. Nothing further needed at this time.

## 2016-07-28 NOTE — Assessment & Plan Note (Signed)
Body mass index is 34.96  Lab Results  Component Value Date   TSH 2.64 07/27/2016     Contributing to gerd tendency/ doe/reviewed the need and the process to achieve and maintain neg calorie balance > defer f/u primary care including intermittently monitoring thyroid status

## 2016-07-28 NOTE — Assessment & Plan Note (Addendum)
Echo 02/28/14   -Left ventricle: The cavity size was normal. Wall thickness was normal. Systolic function was normal. The estimated ejection fraction was in the range of 55% to 60%. Restarted eliquis 07/27/2016 >>>  Chronic and Not rapid enough to require  admit or start BB today as has cards appt tomorrow

## 2016-07-28 NOTE — Assessment & Plan Note (Signed)
Classic chf pattern cxr and bnp in setting of RAF and elevated bp all point to chf related to AF ? All diastolic dysfunction > has appt with Dr Stanford Breed 07/28/16 so rec he keep it and no change in meds this afternoon except to start back to loading doses of eliquis as there is sign risk here of cva and DVT/ PE for which a baseline d dimer was obtained ( though only change in rx would be to do venous dopplers if Pos)   Total time devoted to counseling  = 35/70m review case with pt/ discussion of options/alternatives/ personally creating written instructions  in presence of pt  then going over those specific  Instructions directly with the pt including how to use all of the meds but in particular covering each new medication in detail and the difference between the maintenance/automatic meds and the prns using an action plan format for the latter.

## 2016-07-28 NOTE — Assessment & Plan Note (Signed)
HST 10/2013: AHI 63/hr.   Will need download and f/u with sleep doc p rx for chf

## 2016-07-28 NOTE — Progress Notes (Signed)
Spoke with pt and notified of results per Dr. Wert. Pt verbalized understanding and denied any questions. 

## 2016-07-28 NOTE — Assessment & Plan Note (Signed)
C/w chf > f/u here prn if not resolved p cards rx

## 2016-07-30 ENCOUNTER — Telehealth: Payer: Self-pay | Admitting: Internal Medicine

## 2016-07-30 NOTE — Telephone Encounter (Signed)
Spoke with pt, requesting results from cxr and labs be mailed to him.  Verified address on file.  Results printed and placed in outgoing mail.  Nothing further needed.

## 2016-08-03 ENCOUNTER — Telehealth: Payer: Self-pay | Admitting: Cardiology

## 2016-08-10 ENCOUNTER — Telehealth: Payer: Self-pay | Admitting: Cardiology

## 2016-08-10 NOTE — Telephone Encounter (Signed)
Received records from [facility] for appointment on 08/12/16 with Kerin Ransom, [place].  Records given to [employer] (medical records) for Luke's schedule on 08/12/16. lp

## 2016-08-12 ENCOUNTER — Encounter: Payer: Self-pay | Admitting: Cardiology

## 2016-08-12 ENCOUNTER — Ambulatory Visit (INDEPENDENT_AMBULATORY_CARE_PROVIDER_SITE_OTHER): Payer: Managed Care, Other (non HMO) | Admitting: Cardiology

## 2016-08-12 DIAGNOSIS — I420 Dilated cardiomyopathy: Secondary | ICD-10-CM | POA: Diagnosis not present

## 2016-08-12 DIAGNOSIS — I509 Heart failure, unspecified: Secondary | ICD-10-CM | POA: Insufficient documentation

## 2016-08-12 DIAGNOSIS — R9431 Abnormal electrocardiogram [ECG] [EKG]: Secondary | ICD-10-CM

## 2016-08-12 DIAGNOSIS — I5041 Acute combined systolic (congestive) and diastolic (congestive) heart failure: Secondary | ICD-10-CM | POA: Diagnosis not present

## 2016-08-12 DIAGNOSIS — Z7901 Long term (current) use of anticoagulants: Secondary | ICD-10-CM | POA: Diagnosis not present

## 2016-08-12 DIAGNOSIS — I480 Paroxysmal atrial fibrillation: Secondary | ICD-10-CM

## 2016-08-12 MED ORDER — DILTIAZEM HCL ER COATED BEADS 120 MG PO CP24: 120.0000 mg | ORAL_CAPSULE | Freq: Every day | ORAL | 1 refills | Status: DC

## 2016-08-12 MED ORDER — COLCHICINE 0.6 MG PO TABS: 0.6000 mg | ORAL_TABLET | Freq: Two times a day (BID) | ORAL | 1 refills | Status: DC

## 2016-08-12 NOTE — Assessment & Plan Note (Signed)
CHADs VASc=1. Eliquis resumed two weeks ago

## 2016-08-12 NOTE — Assessment & Plan Note (Signed)
DCCV Jan 2015- recurrent PAF April 2015-Tikosyn and repeat DCCV to NSR Now with recurrent AF with RVR and prolonged Qt and possible NSVT

## 2016-08-12 NOTE — Assessment & Plan Note (Signed)
On Tikosyn 375 mg BID with history of what sounds like symptomatic NSVT

## 2016-08-12 NOTE — Progress Notes (Signed)
08/12/2016 Larry Evans   1966-07-22  [id number]  Primary Physician Aretta Nip, MD Primary Cardiologist: Dr Stanford Breed Dr Rayann Heman  HPI:  50 y/o male with a history of PAF first noted in Jan 2015. He had a cardioversion then but went back into AF. He was placed on Tikosyn in April 2015 and cardioverted again. In June his EF had improved to 55%. His LOV was Nov 2016 with dr Stanford Breed. The pt had a change in his insurance and had trouble affording Tikosyn this spring. He asked Dr Rayann Heman if he could cut it back to 250 mg BID which he did. He noted increased HR around Oct associated with dyspnea and abdominal bloating. He increased his Tikosyn back to 375 mg BID on his own.  His PCP saw him 08/02/16 and had him start Lasix 40 mg, Toprol 50 mg, and resume his Eliquis. He is seen today by me for further evaluation. The pt did loose almost 10 lbs with Lasix and he is back at his baseline wgt. He still has some edema and DOE. He also describes two episodes of sudden sever tachycardia associated with diaphoresis that lasted about 10 min. He denies syncope. He has noted some gout symptoms since he started Toprol, he has had this problem before as well.    Current Outpatient Prescriptions  Medication Sig Dispense Refill  . apixaban (ELIQUIS) 5 MG TABS tablet TAKE 1 TABLET (5 MG TOTAL) BY MOUTH 2 (TWO) TIMES DAILY. 60 tablet 5  . colchicine (COLCRYS) 0.6 MG tablet Take 1 tablet (0.6 mg total) by mouth daily. 22 tablet 0  . dofetilide (TIKOSYN) 125 MCG capsule Take 1 capsule by mouth every 12 (twelve) hours.  0  . dofetilide (TIKOSYN) 250 MCG capsule Take 1 capsule (250 mcg total) by mouth every 12 (twelve) hours. 60 capsule 6  . furosemide (LASIX) 20 MG tablet Take 1 tablet (20 mg total) by mouth as needed for fluid. 30 tablet 0  . metoprolol succinate (TOPROL-XL) 50 MG 24 hr tablet Take 1 tablet by mouth daily.  0   No current facility-administered medications for this visit.     No Known  Allergies  Social History   Social History  . Marital status: Divorced    Spouse name: N/A  . Number of children: 1  . Years of education: N/A   Occupational History  .      Programmer, applications   Social History Main Topics  . Smoking status: Former Smoker    Packs/day: 0.25    Years: 5.00    Types: Cigarettes    Quit date: 10/05/2012  . Smokeless tobacco: Never Used  . Alcohol use 1.2 oz/week    2 Cans of beer per week     Comment: heavy ETOH /// 6pk /week(07/27/16)  . Drug use: No  . Sexual activity: Yes    Birth control/ protection: Condom   Other Topics Concern  . Not on file   Social History Narrative  . No narrative on file     Review of Systems: General: negative for chills, fever, night sweats or weight changes.  Cardiovascular: negative for chest pain, dyspnea on exertion, edema, orthopnea, palpitations, paroxysmal nocturnal dyspnea or shortness of breath Dermatological: negative for rash Respiratory: negative for cough or wheezing Urologic: negative for hematuria Abdominal: negative for nausea, vomiting, diarrhea, bright red blood per rectum, melena, or hematemesis Neurologic: negative for visual changes, syncope, or dizziness All other systems reviewed and are otherwise negative except  as noted above.    Blood pressure 130/88, pulse (!) 114, height 6' (1.829 m), weight 244 lb 9.6 oz (110.9 kg).  General appearance: alert, cooperative, no distress and moderately obese Neck: no carotid bruit and no JVD Lungs: clear to auscultation bilaterally Heart: irregularly irregular rhythm Abdomen: soft, non-tender; bowel sounds normal; no masses,  no organomegaly Extremities: extremities normal, atraumatic, no cyanosis or edema and trace edema Pulses: 2+ and symmetric Skin: Skin color, texture, turgor normal. No rashes or lesions Neurologic: Grossly normal  EKG AF with VR 115, QTc 512  ASSESSMENT AND PLAN:   PAF (paroxysmal atrial fibrillation) [facility]) DCCV Jan  2015- recurrent PAF April 2015-Tikosyn and repeat DCCV to NSR Now with recurrent AF with RVR and prolonged Qt and possible NSVT   Congestive dilated cardiomyopathy [facility]) DCCV Jan 2015- recurrent PAF April 2015-Tikosyn and repeat DCCV to NSR Now with recurrent AF with RVR and prolonged Qt and possible NSVT   Acute CHF ([place]) CHF from recurrent AF with RVR  Anticoagulated CHADs VASc=1. Eliquis resumed two weeks ago  Prolonged QT interval On Tikosyn 375 mg BID with history of what sounds like symptomatic NSVT   PLAN  Initially I discussed the case with Dr Ellyn Hack who felt admission would be the most expeditious way to get him stabilized. The pt declined this option stating insurance issues. I spoke with Dr Curt Bears who agreed with a plan to stop his Tikosyn, start Diltiazem CD 120 for rate control in addition to Toprol 50mg , Lasix 40 mg, and Eliquis 5 mg BID. He'll f/u in the AF clinic next week.   Kerin Ransom PA-C 08/12/2016 11:07 AM

## 2016-08-12 NOTE — Patient Instructions (Signed)
Medication Instructions: STOP Tikosyn  START Diltiazem 120 mg dialy---start on 08/14/16, a full day after stopping Tikosyn  Continue Lasix 40 mg daily and Metoprolol 50 mg daily.   Follow-Up: Your physician recommends that you schedule a follow-up appointment NEXT WEEK with AFIB Clinic  If you need a refill on your cardiac medications before your next appointment, please call your pharmacy.

## 2016-08-12 NOTE — Assessment & Plan Note (Signed)
CHF from recurrent AF with RVR

## 2016-08-18 ENCOUNTER — Ambulatory Visit (HOSPITAL_COMMUNITY)
Admission: RE | Admit: 2016-08-18 | Discharge: 2016-08-18 | Disposition: A | Discharge status: Home / Self Care | Payer: Managed Care, Other (non HMO) | Source: Ambulatory Visit | Attending: Nurse Practitioner | Admitting: Nurse Practitioner

## 2016-08-18 ENCOUNTER — Encounter (HOSPITAL_COMMUNITY): Payer: Self-pay | Admitting: Nurse Practitioner

## 2016-08-18 VITALS — BP 158/98 | HR 131 | Ht 72.0 in | Wt 251.0 lb

## 2016-08-18 DIAGNOSIS — I481 Persistent atrial fibrillation: Secondary | ICD-10-CM

## 2016-08-18 DIAGNOSIS — I4819 Other persistent atrial fibrillation: Secondary | ICD-10-CM

## 2016-08-18 DIAGNOSIS — I480 Paroxysmal atrial fibrillation: Secondary | ICD-10-CM

## 2016-08-18 LAB — BASIC METABOLIC PANEL
Anion gap: 9 (ref 5–15)
BUN: 24 mg/dL — ABNORMAL HIGH (ref 6–20)
CALCIUM: 9.3 mg/dL (ref 8.9–10.3)
CHLORIDE: 106 mmol/L (ref 101–111)
CO2: 25 mmol/L (ref 22–32)
CREATININE: 1.47 mg/dL — AB (ref 0.61–1.24)
GFR calc non Af Amer: 54 mL/min — ABNORMAL LOW (ref >60)
GLUCOSE: 163 mg/dL — AB (ref 65–99)
Potassium: 4.2 mmol/L (ref 3.5–5.1)
Sodium: 140 mmol/L (ref 135–145)

## 2016-08-18 LAB — MAGNESIUM: Magnesium: 2.1 mg/dL (ref 1.7–2.4)

## 2016-08-18 MED ORDER — DILTIAZEM HCL ER COATED BEADS 120 MG PO CP24: 120.0000 mg | ORAL_CAPSULE | Freq: Two times a day (BID) | ORAL | 1 refills | Status: DC

## 2016-08-18 MED ORDER — DILTIAZEM HCL ER COATED BEADS 120 MG PO CP24: 120.0000 mg | ORAL_CAPSULE | Freq: Two times a day (BID) | ORAL | 3 refills | Status: DC

## 2016-08-18 NOTE — Patient Instructions (Signed)
Your physician has recommended you make the following change in your medication:  1)Increase cardizem to 120mg twice a day  

## 2016-08-19 NOTE — Progress Notes (Signed)
Primary Care Physician: Aretta Nip, MD Referring Physician: Dr. Levy Pupa TSUYOSHI Larry Evans is a 50 y.o. male in the atrial fibrillation clinic for evaluation. He was placed on tikosyn in April 2015 and with cardioversion converted to SR.With maintenance of SR, his EF improved to 55%.  He did well for around a year in SR. He had an insurance change this spring and reduced his amount of tikosyn with approval of Dr. Rayann Heman, to 250 mcg bid. He noticed increased HR around October of this year with associated shortness of breath and abdominal bloating, at which time his PCP started lasix, toprol and resumed eliquis. He did lose 10 lbs with diuretic. Pt increased tikosyn on his back to 375 mcg bid. He described two episodes of severe tachycardia associated with diaphoresis.  Tikosyn was stopped when on visit of 12/7 with Kerin Ransom. Cardizem was added.  Today in the afib clinic, he remains in afib with RVR. He is symptomatic with exertional dyspnea and fatigue. He was drinking some alcohol but has not had any x 3 weeks. He is drinking minimal caffeine. No regular exercise and is obese with a BMI of 34. He has OSA, using cpap.  He denies symptoms of palpitations, chest pain, shortness of breath, orthopnea, PND, lower extremity edema, dizziness, presyncope, syncope, or neurologic sequela. The patient is tolerating medications without difficulties and is otherwise without complaint today.   Past Medical History:  Diagnosis Date  . Non-ischemic cardiomyopathy ([place])    a. 09/2013 Echo: EF 35-40%;  b. 02/2014 Echo: EF 55-60%.  . Obstructive sleep apnea    a. uses CPAP  . Persistent atrial fibrillation ([place])    a. dx 08/2013 -> Apixaban started 08/2013 (CHA2DS2VASc = 1);  b. 09/2013 DCCV;  c. 12/2013 Tikosyn initiated for recurrent AF and DCCV performed.   Past Surgical History:  Procedure Laterality Date  . CARDIOVERSION N/A 10/05/2013   Procedure: CARDIOVERSION;  Surgeon: Fay Records, MD;   Location: [facility] ENDOSCOPY;  Service: Cardiovascular;  Laterality: N/A;  12:35       elective cardioversion, Propofol  60  mg, IV adminiter by Dr. Gwenlyn Fudge, anesthesia...synch 200 joules by Dr. Harrington Challenger.....converted to NSR....  . CARDIOVERSION N/A 12/28/2013   Procedure: CARDIOVERSION BEDSIDE;  Surgeon: Thayer Headings, MD;  Location: [facility] OR;  Service: Cardiovascular;  Laterality: N/A;  . HERNIA REPAIR      Current Outpatient Prescriptions  Medication Sig Dispense Refill  . apixaban (ELIQUIS) 5 MG TABS tablet TAKE 1 TABLET (5 MG TOTAL) BY MOUTH 2 (TWO) TIMES DAILY. 60 tablet 5  . diltiazem (CARDIZEM CD) 120 MG 24 hr capsule Take 1 capsule (120 mg total) by mouth 2 (two) times daily. 60 capsule 3  . furosemide (LASIX) 20 MG tablet Take 1 tablet (20 mg total) by mouth as needed for fluid. 30 tablet 0  . metoprolol succinate (TOPROL-XL) 50 MG 24 hr tablet Take 1 tablet by mouth daily.  0  . colchicine (COLCRYS) 0.6 MG tablet Take 1 tablet (0.6 mg total) by mouth 2 (two) times daily. (Patient not taking: Reported on 08/18/2016) 180 tablet 1   No current facility-administered medications for this encounter.     No Known Allergies  Social History   Social History  . Marital status: Divorced    Spouse name: N/A  . Number of children: 1  . Years of education: N/A   Occupational History  .      Programmer, applications   Social History Main  Topics  . Smoking status: Former Smoker    Packs/day: 0.25    Years: 5.00    Types: Cigarettes    Quit date: 10/05/2012  . Smokeless tobacco: Never Used  . Alcohol use 1.2 oz/week    2 Cans of beer per week     Comment: heavy ETOH /// 6pk /week(07/27/16)  . Drug use: No  . Sexual activity: Yes    Birth control/ protection: Condom   Other Topics Concern  . Not on file   Social History Narrative  . No narrative on file    Family History  Problem Relation Age of Onset  . Lung cancer Maternal Grandmother   . Throat cancer Maternal Grandfather   . Heart  disease      No family history    ROS- All systems are reviewed and negative except as per the HPI above  Physical Exam: Vitals:   08/18/16 1536  BP: (!) 158/98  Pulse: (!) 131  Weight: 251 lb (113.9 kg)  Height: 6' (1.829 m)   Wt Readings from Last 3 Encounters:  08/18/16 251 lb (113.9 kg)  08/12/16 244 lb 9.6 oz (110.9 kg)  07/27/16 257 lb 12.8 oz (116.9 kg)    Labs: Lab Results  Component Value Date   NA 140 08/18/2016   K 4.2 08/18/2016   CL 106 08/18/2016   CO2 25 08/18/2016   GLUCOSE 163 (H) 08/18/2016   BUN 24 (H) 08/18/2016   CREATININE 1.47 (H) 08/18/2016   CALCIUM 9.3 08/18/2016   MG 2.1 08/18/2016   Lab Results  Component Value Date   INR 0.85 07/01/2009   No results found for: CHOL, HDL, LDLCALC, TRIG   GEN- The patient is well appearing, alert and oriented x 3 today.   Head- normocephalic, atraumatic Eyes-  Sclera clear, conjunctiva pink Ears- hearing intact Oropharynx- clear Neck- supple, no JVP Lymph- no cervical lymphadenopathy Lungs- Clear to ausculation bilaterally, normal work of breathing Heart- Rapid irregular rate and rhythm, no murmurs, rubs or gallops, PMI not laterally displaced GI- soft, NT, ND, + BS Extremities- no clubbing, cyanosis, or edema MS- no significant deformity or atrophy Skin- no rash or lesion Psych- euthymic mood, full affect Neuro- strength and sensation are intact  EKG- afib with RVR, st/t wave, qrs int 84 ms, qtc 475 ms Epic records reviewed    Assessment and Plan: 1. Persistent Afib Off Tikosyn with failure od drug to keep in rhythm  Will increase Cardizem to 120 mg bid, continue metoprolol 50 mg a day Continue apixaban 5 mg bid Continue lasix bmet today Options discussed with pt and he would like to be considered for an ablation  2. Lifestyle issues Continue treatment of OSA with cpap Encouraged exercise and weight loss  F/u on Monday for further rate control evaluation Appointment with Dr. Rayann Heman  requested  Geroge Baseman. Iker Nuttall, [facility] [address] [place], [place] [address] [phone]

## 2016-08-20 ENCOUNTER — Other Ambulatory Visit: Payer: Self-pay | Admitting: Cardiology

## 2016-08-23 ENCOUNTER — Encounter (HOSPITAL_COMMUNITY): Payer: Self-pay | Admitting: Nurse Practitioner

## 2016-08-23 ENCOUNTER — Ambulatory Visit (HOSPITAL_COMMUNITY)
Admission: RE | Admit: 2016-08-23 | Discharge: 2016-08-23 | Disposition: A | Discharge status: Home / Self Care | Payer: Managed Care, Other (non HMO) | Source: Ambulatory Visit | Attending: Nurse Practitioner | Admitting: Nurse Practitioner

## 2016-08-23 DIAGNOSIS — I4891 Unspecified atrial fibrillation: Secondary | ICD-10-CM | POA: Diagnosis not present

## 2016-08-23 DIAGNOSIS — R0602 Shortness of breath: Secondary | ICD-10-CM | POA: Diagnosis present

## 2016-08-23 MED ORDER — METOPROLOL SUCCINATE ER 50 MG PO TB24: 50.0000 mg | ORAL_TABLET | Freq: Every day | ORAL | 6 refills | Status: DC

## 2016-08-23 NOTE — Progress Notes (Addendum)
Pt in for EKG and BP.  Pt HR today 119 with a BP of 160/102.  Pt states he has taken 120 mg diltiazem this morning.  EKG to be reviewed by Roderic Palau, NP  Pt states since cardizem dose increased, he has felt better with less  Shortness of breath and more energy. HR is 119 today but at home he is in the 80's.  Pulse ox in office after several mins of sitting is in the 70's.  He is awaiting an appointment with Dr. Rayann Heman to discuss ablation.

## 2016-08-25 ENCOUNTER — Encounter (INDEPENDENT_AMBULATORY_CARE_PROVIDER_SITE_OTHER): Payer: Self-pay

## 2016-08-25 ENCOUNTER — Encounter: Payer: Self-pay | Admitting: Internal Medicine

## 2016-08-25 ENCOUNTER — Ambulatory Visit (INDEPENDENT_AMBULATORY_CARE_PROVIDER_SITE_OTHER): Payer: Managed Care, Other (non HMO) | Admitting: Internal Medicine

## 2016-08-25 VITALS — BP 130/92 | HR 83 | Ht 72.0 in | Wt 239.4 lb

## 2016-08-25 DIAGNOSIS — I480 Paroxysmal atrial fibrillation: Secondary | ICD-10-CM | POA: Diagnosis not present

## 2016-08-25 NOTE — Patient Instructions (Addendum)
Medication Instructions:  Your physician recommends that you continue on your current medications as directed. Please refer to the Current Medication list given to you today.   Labwork: Your physician recommends that you return for lab work in: 1/18/1/ at 4 PM---you do not have to fast    Testing/Procedures: Your physician has requested that you have a TEE. During a TEE, sound waves are used to create images of your heart. It provides your doctor with information about the size and shape of your heart and how well your heart's chambers and valves are working. In this test, a transducer is attached to the end of a flexible tube that's guided down your throat and into your esophagus (the tube leading from you mouth to your stomach) to get a more detailed image of your heart. You are not awake for the procedure. Please see the instruction sheet given to you today. For further information please visit [email or web address]   Your physician has recommended that you have an ablation. Catheter ablation is a medical procedure used to treat some cardiac arrhythmias (irregular heartbeats). During catheter ablation, a long, thin, flexible tube is put into a blood vessel in your groin (upper thigh), or neck. This tube is called an ablation catheter. It is then guided to your heart through the blood vessel. Radio frequency waves destroy small areas of heart tissue where abnormal heartbeats may cause an arrhythmia to start. Please see the instruction sheet given to you today.---09/30/16   Please arrive at [facility] at 7:30am Do not eat or drink after midnight the night prior to the procedure Do not take any medications the morning of the procedure Plan for one night stay You will need someone to drive you home at discharge    Follow-Up: Your physician recommends that you schedule a follow-up appointment in: 4 weeks from 09/30/16 with Roderic Palau, NP  in [place] clinic and 3 months from 09/30/16 with Dr Rayann Heman   Any Other Special Instructions Will Be Listed Below (If Applicable).     If you need a refill on your cardiac medications before your next appointment, please call your pharmacy.

## 2016-08-26 NOTE — Progress Notes (Signed)
Primary Care Physician: Aretta Nip, MD Referring Physician:  Dr Levy Pupa Larry Evans is a 50 y.o. male with a h/o persistent atrial fibrillation, ETOh abuse, and sleep apnea (compliant with CPAP) who presents for EP follow-up.  He reports initially being diagnosed with atrial fibrillation 12/14, though in retrospect, he has had palpitations "for years".  He reports symptoms of fatigue and decreased exercise tolerance with his afib.    He underwent cardioversion 10/05/13.  He reports significant improvement in exercise tolerance and fatigue but has unfortunately returned to afib.  He has a h/o heavy ETOH but has significantly "cut back".  He has also been diagnosed with OSA and uses his CPAP. He was evaluated by me and was placed on tikosyn 4/15.  He did well for about a year.  Recently, he developed palpitations for which his Phyllis Ginger was stopped by [employer] 08/12/16.  He has returned to afib.  He has been seen in the AF clinic and now presents for EP follow-up.  Today, he denies symptoms of chest pain, shortness of breath, orthopnea, PND, lower extremity edema, dizziness, presyncope, syncope, or neurologic sequela. The patient is tolerating medications without difficulties and is otherwise without complaint today.   Past Medical History:  Diagnosis Date  . Non-ischemic cardiomyopathy ([place])    a. 09/2013 Echo: EF 35-40%;  b. 02/2014 Echo: EF 55-60%.  . Obstructive sleep apnea    a. uses CPAP  . Persistent atrial fibrillation ([place])    a. dx 08/2013 -> Apixaban started 08/2013 (CHA2DS2VASc = 1);  b. 09/2013 DCCV;  c. 12/2013 Tikosyn initiated for recurrent AF and DCCV performed.   Past Surgical History:  Procedure Laterality Date  . CARDIOVERSION N/A 10/05/2013   Procedure: CARDIOVERSION;  Surgeon: Fay Records, MD;  Location: [facility] ENDOSCOPY;  Service: Cardiovascular;  Laterality: N/A;  12:35       elective cardioversion, Propofol  60  mg, IV adminiter by Dr. Gwenlyn Fudge,  anesthesia...synch 200 joules by Dr. Harrington Challenger.....converted to NSR....  . CARDIOVERSION N/A 12/28/2013   Procedure: CARDIOVERSION BEDSIDE;  Surgeon: Thayer Headings, MD;  Location: [facility] OR;  Service: Cardiovascular;  Laterality: N/A;  . HERNIA REPAIR      Current Outpatient Prescriptions  Medication Sig Dispense Refill  . apixaban (ELIQUIS) 5 MG TABS tablet TAKE 1 TABLET (5 MG TOTAL) BY MOUTH 2 (TWO) TIMES DAILY. 60 tablet 5  . diltiazem (CARDIZEM CD) 120 MG 24 hr capsule Take 1 capsule (120 mg total) by mouth 2 (two) times daily. 60 capsule 3  . furosemide (LASIX) 20 MG tablet TAKE 1 TABLET BY MOUTH AS NEEDED FOR FLUID 30 tablet 0  . metoprolol succinate (TOPROL-XL) 50 MG 24 hr tablet Take 1 tablet (50 mg total) by mouth daily. 30 tablet 6   No current facility-administered medications for this visit.     No Known Allergies  Social History   Social History  . Marital status: Divorced    Spouse name: N/A  . Number of children: 1  . Years of education: N/A   Occupational History  .      Programmer, applications   Social History Main Topics  . Smoking status: Former Smoker    Packs/day: 0.25    Years: 5.00    Types: Cigarettes    Quit date: 10/05/2012  . Smokeless tobacco: Never Used  . Alcohol use 1.2 oz/week    2 Cans of beer per week     Comment: heavy ETOH /// 6pk /week(07/27/16)  .  Drug use: No  . Sexual activity: Yes    Birth control/ protection: Condom   Other Topics Concern  . Not on file   Social History Narrative  . No narrative on file    Family History  Problem Relation Age of Onset  . Lung cancer Maternal Grandmother   . Throat cancer Maternal Grandfather   . Heart disease      No family history    ROS- All systems are reviewed and negative except as per the HPI above  Physical Exam: Vitals:   08/25/16 1501  BP: (!) 130/92  Pulse: 83  Weight: 239 lb 6.4 oz (108.6 kg)  Height: 6' (1.829 m)    GEN- The patient is well appearing, alert and oriented x 3  today.   Head- normocephalic, atraumatic Eyes-  Sclera clear, conjunctiva pink Ears- hearing intact Oropharynx- clear Neck- supple,   Lungs- Clear to ausculation bilaterally, normal work of breathing Heart- irregular rate and rhythm, no murmurs, rubs or gallops, PMI not laterally displaced GI- soft, NT, ND, + BS Extremities- no clubbing, cyanosis, or edema MS- no significant deformity or atrophy Skin- no rash or lesion Psych- euthymic mood, full affect Neuro- strength and sensation are intact  EKG 08/23/16 reveals afib  Assessment and Plan:  1. Persistent afib The patient has persistent symptomatic atrial fibrillation.  He has failed medical therapy with tikosyn.  Lifestyle modification is encouraged.  He has worked hard at this. Therapeutic strategies for afib including medicine and ablation were discussed in detail with the patient today. Risk, benefits, and alternatives to EP study and radiofrequency ablation for afib were also discussed in detail today. These risks include but are not limited to stroke, bleeding, vascular damage, tamponade, perforation, damage to the esophagus, lungs, and other structures, pulmonary vein stenosis, worsening renal function, and death. The patient understands these risk and wishes to proceed.  We will therefore proceed with catheter ablation at the next available time.  Will obtain TEE prior to ablation to evaluate for LAA thrombus.  2. OSA Compliance with cpap encouraged  3. ETOH Cessation of ETOH is advised  Thompson Grayer, MD

## 2016-08-31 ENCOUNTER — Telehealth: Payer: Self-pay | Admitting: Internal Medicine

## 2016-08-31 DIAGNOSIS — G4733 Obstructive sleep apnea (adult) (pediatric): Secondary | ICD-10-CM

## 2016-08-31 NOTE — Telephone Encounter (Signed)
Sleep consult 1st available

## 2016-08-31 NOTE — Telephone Encounter (Signed)
Pt called and stated that he needs to have his cpap adjusted.  Pt was last seen by  MW on 08/02/16.  MW will you be following him for his cpap as well, or do we need to get him set up with a sleep doc?  Please advise. thanks

## 2016-09-01 NOTE — Telephone Encounter (Signed)
lmom tcb x1  Per MW pt. Needs first opening with a sleep doc. To follow him on his cpap

## 2016-09-02 NOTE — Telephone Encounter (Signed)
All I can do for now is offer to review a download and make some recs based on that because I'm not sure exactly what he's doing with the cpap

## 2016-09-02 NOTE — Telephone Encounter (Signed)
Called and spoke with pt and he is aware of this and will schedule an appt--he stated that he needs the pressure turned down some and wanted to see if MW could do this and he will schedule an appt.  MW please advise. Thanks

## 2016-09-02 NOTE — Telephone Encounter (Signed)
Download printed and given to MW. Per MW verbally, decrease settings to 5-10cm and ref to sleep doc. LM to make pt aware of this.

## 2016-09-02 NOTE — Telephone Encounter (Signed)
Closed encounter °

## 2016-09-06 HISTORY — PX: CARDIAC ELECTROPHYSIOLOGY STUDY AND ABLATION: SHX1294

## 2016-09-07 NOTE — Telephone Encounter (Signed)
Order placed for apria to set the cpap at 5-10 cm per MW.  Pt is aware that he will need to schedule an appt with new sleep doc to follow his cpap issues.

## 2016-09-10 ENCOUNTER — Other Ambulatory Visit (HOSPITAL_COMMUNITY): Payer: Self-pay | Admitting: *Deleted

## 2016-09-10 MED ORDER — APIXABAN 5 MG PO TABS: ORAL_TABLET | ORAL | 5 refills | Status: DC

## 2016-09-14 ENCOUNTER — Ambulatory Visit: Payer: Managed Care, Other (non HMO) | Admitting: Cardiology

## 2016-09-23 ENCOUNTER — Other Ambulatory Visit: Payer: Managed Care, Other (non HMO)

## 2016-09-24 ENCOUNTER — Other Ambulatory Visit: Payer: Managed Care, Other (non HMO) | Admitting: *Deleted

## 2016-09-24 ENCOUNTER — Telehealth: Payer: Self-pay | Admitting: Internal Medicine

## 2016-09-24 DIAGNOSIS — I480 Paroxysmal atrial fibrillation: Secondary | ICD-10-CM

## 2016-09-24 NOTE — Telephone Encounter (Signed)
Spoke with patient and he will come today for labs.

## 2016-09-24 NOTE — Telephone Encounter (Signed)
Mr.Pettijohn is calling to see when he need to come in for labs before his ablation on 09/30/16 .    Please call

## 2016-09-27 ENCOUNTER — Other Ambulatory Visit: Payer: Self-pay | Admitting: Cardiology

## 2016-09-28 ENCOUNTER — Other Ambulatory Visit (HOSPITAL_COMMUNITY): Payer: Self-pay | Admitting: *Deleted

## 2016-09-28 MED ORDER — METOPROLOL SUCCINATE ER 50 MG PO TB24: 50.0000 mg | ORAL_TABLET | Freq: Two times a day (BID) | ORAL | 3 refills | Status: DC

## 2016-09-30 ENCOUNTER — Encounter (HOSPITAL_COMMUNITY): Payer: Self-pay | Admitting: *Deleted

## 2016-09-30 ENCOUNTER — Ambulatory Visit (HOSPITAL_COMMUNITY): Payer: Managed Care, Other (non HMO) | Admitting: Anesthesiology

## 2016-09-30 ENCOUNTER — Ambulatory Visit (HOSPITAL_COMMUNITY)
Admission: RE | Admit: 2016-09-30 | Discharge: 2016-10-01 | Disposition: A | Discharge status: Home / Self Care | Payer: Managed Care, Other (non HMO) | Source: Ambulatory Visit | Attending: Internal Medicine | Admitting: Internal Medicine

## 2016-09-30 ENCOUNTER — Encounter (HOSPITAL_COMMUNITY)
Admission: RE | Disposition: A | Discharge status: Home / Self Care | Payer: Self-pay | Source: Ambulatory Visit | Attending: Internal Medicine

## 2016-09-30 ENCOUNTER — Ambulatory Visit (HOSPITAL_BASED_OUTPATIENT_CLINIC_OR_DEPARTMENT_OTHER): Payer: Managed Care, Other (non HMO)

## 2016-09-30 DIAGNOSIS — Z7901 Long term (current) use of anticoagulants: Secondary | ICD-10-CM | POA: Diagnosis not present

## 2016-09-30 DIAGNOSIS — I481 Persistent atrial fibrillation: Secondary | ICD-10-CM | POA: Insufficient documentation

## 2016-09-30 DIAGNOSIS — Z6832 Body mass index (BMI) 32.0-32.9, adult: Secondary | ICD-10-CM | POA: Insufficient documentation

## 2016-09-30 DIAGNOSIS — F1010 Alcohol abuse, uncomplicated: Secondary | ICD-10-CM | POA: Insufficient documentation

## 2016-09-30 DIAGNOSIS — I4891 Unspecified atrial fibrillation: Secondary | ICD-10-CM

## 2016-09-30 DIAGNOSIS — Z801 Family history of malignant neoplasm of trachea, bronchus and lung: Secondary | ICD-10-CM | POA: Diagnosis not present

## 2016-09-30 DIAGNOSIS — I509 Heart failure, unspecified: Secondary | ICD-10-CM | POA: Insufficient documentation

## 2016-09-30 DIAGNOSIS — I340 Nonrheumatic mitral (valve) insufficiency: Secondary | ICD-10-CM | POA: Diagnosis not present

## 2016-09-30 DIAGNOSIS — Z87891 Personal history of nicotine dependence: Secondary | ICD-10-CM | POA: Insufficient documentation

## 2016-09-30 DIAGNOSIS — G4733 Obstructive sleep apnea (adult) (pediatric): Secondary | ICD-10-CM | POA: Diagnosis not present

## 2016-09-30 DIAGNOSIS — I428 Other cardiomyopathies: Secondary | ICD-10-CM | POA: Insufficient documentation

## 2016-09-30 HISTORY — PX: ELECTROPHYSIOLOGIC STUDY: SHX172A

## 2016-09-30 HISTORY — PX: TEE WITHOUT CARDIOVERSION: SHX5443

## 2016-09-30 LAB — CBC
HCT: 46.7 % (ref 39.0–52.0)
Hemoglobin: 15.9 g/dL (ref 13.0–17.0)
MCH: 29.1 pg (ref 26.0–34.0)
MCHC: 34 g/dL (ref 30.0–36.0)
MCV: 85.4 fL (ref 78.0–100.0)
Platelets: 184 10*3/uL (ref 150–400)
RBC: 5.47 MIL/uL (ref 4.22–5.81)
RDW: 13.3 % (ref 11.5–15.5)
WBC: 6.2 10*3/uL (ref 4.0–10.5)

## 2016-09-30 LAB — POCT ACTIVATED CLOTTING TIME
ACTIVATED CLOTTING TIME: 312 s
ACTIVATED CLOTTING TIME: 334 s
ACTIVATED CLOTTING TIME: 202 s
ACTIVATED CLOTTING TIME: 197 s
ACTIVATED CLOTTING TIME: 180 s
Activated Clotting Time: 285 seconds
Activated Clotting Time: 290 seconds

## 2016-09-30 LAB — BASIC METABOLIC PANEL
Anion gap: 10 (ref 5–15)
BUN: 18 mg/dL (ref 6–20)
CALCIUM: 9.4 mg/dL (ref 8.9–10.3)
CHLORIDE: 104 mmol/L (ref 101–111)
CO2: 27 mmol/L (ref 22–32)
CREATININE: 1.04 mg/dL (ref 0.61–1.24)
GFR calc Af Amer: >60 mL/min (ref >60)
GFR calc non Af Amer: >60 mL/min (ref >60)
Glucose, Bld: 122 mg/dL — ABNORMAL HIGH (ref 65–99)
Potassium: 4.5 mmol/L (ref 3.5–5.1)
SODIUM: 141 mmol/L (ref 135–145)

## 2016-09-30 SURGERY — ECHOCARDIOGRAM, TRANSESOPHAGEAL
Anesthesia: Moderate Sedation

## 2016-09-30 SURGERY — ATRIAL FIBRILLATION ABLATION
Anesthesia: Monitor Anesthesia Care

## 2016-09-30 MED ORDER — PROTAMINE SULFATE 10 MG/ML IV SOLN
INTRAVENOUS | Status: DC | PRN
  Administered 2016-09-30: 30 mg via INTRAVENOUS

## 2016-09-30 MED ORDER — HYDROCODONE-ACETAMINOPHEN 5-325 MG PO TABS
1.0000 | ORAL_TABLET | ORAL | Status: DC | PRN
Start: 2016-09-30 — End: 2016-10-01
  Administered 2016-09-30 – 2016-10-01 (×4): 2 via ORAL
  Filled 2016-09-30 (×4): qty 2

## 2016-09-30 MED ORDER — LIDOCAINE HCL (CARDIAC) 20 MG/ML IV SOLN
INTRAVENOUS | Status: DC | PRN
  Administered 2016-09-30: 60 mg via INTRAVENOUS

## 2016-09-30 MED ORDER — MIDAZOLAM HCL 5 MG/ML IJ SOLN
INTRAMUSCULAR | Status: AC
  Filled 2016-09-30: qty 2

## 2016-09-30 MED ORDER — LIDOCAINE VISCOUS 2 % MT SOLN
OROMUCOSAL | Status: AC
Start: 2016-09-30 — End: 2016-09-30
  Filled 2016-09-30: qty 30

## 2016-09-30 MED ORDER — SODIUM CHLORIDE 0.9 % IV SOLN
INTRAVENOUS | Status: DC
  Administered 2016-09-30 (×2): via INTRAVENOUS

## 2016-09-30 MED ORDER — SODIUM CHLORIDE 0.9 % IV SOLN
INTRAVENOUS | Status: DC
  Administered 2016-09-30: 500 mL via INTRAVENOUS

## 2016-09-30 MED ORDER — MIDAZOLAM HCL 5 MG/5ML IJ SOLN
INTRAMUSCULAR | Status: DC | PRN
  Administered 2016-09-30 (×2): 1 mg via INTRAVENOUS

## 2016-09-30 MED ORDER — SODIUM CHLORIDE 0.9% FLUSH: 3.0000 mL | INTRAVENOUS | Status: DC | PRN

## 2016-09-30 MED ORDER — FENTANYL CITRATE (PF) 100 MCG/2ML IJ SOLN
INTRAMUSCULAR | Status: DC | PRN
  Administered 2016-09-30 (×4): 25 ug via INTRAVENOUS

## 2016-09-30 MED ORDER — ONDANSETRON HCL 4 MG/2ML IJ SOLN
INTRAMUSCULAR | Status: DC | PRN
Start: 2016-09-30 — End: 2016-09-30
  Administered 2016-09-30: 4 mg via INTRAVENOUS

## 2016-09-30 MED ORDER — LIDOCAINE VISCOUS 2 % MT SOLN
OROMUCOSAL | Status: DC | PRN
  Administered 2016-09-30: 10 mL via OROMUCOSAL

## 2016-09-30 MED ORDER — ACETAMINOPHEN 325 MG PO TABS: 650.0000 mg | ORAL_TABLET | ORAL | Status: DC | PRN

## 2016-09-30 MED ORDER — HEPARIN SODIUM (PORCINE) 1000 UNIT/ML IJ SOLN
INTRAMUSCULAR | Status: DC | PRN
  Administered 2016-09-30: 1000 [IU] via INTRAVENOUS
  Administered 2016-09-30: 12000 [IU] via INTRAVENOUS
  Administered 2016-09-30: 1000 [IU] via INTRAVENOUS

## 2016-09-30 MED ORDER — DIPHENHYDRAMINE HCL 50 MG/ML IJ SOLN
INTRAMUSCULAR | Status: AC
  Filled 2016-09-30: qty 1

## 2016-09-30 MED ORDER — PROPOFOL 10 MG/ML IV BOLUS
INTRAVENOUS | Status: DC | PRN
  Administered 2016-09-30: 180 mg via INTRAVENOUS

## 2016-09-30 MED ORDER — APIXABAN 5 MG PO TABS
5.0000 mg | ORAL_TABLET | Freq: Two times a day (BID) | ORAL | Status: DC
  Administered 2016-09-30 – 2016-10-01 (×2): 5 mg via ORAL
  Filled 2016-09-30 (×2): qty 1

## 2016-09-30 MED ORDER — SODIUM CHLORIDE 0.9% FLUSH
3.0000 mL | Freq: Two times a day (BID) | INTRAVENOUS | Status: DC
  Administered 2016-09-30: 3 mL via INTRAVENOUS

## 2016-09-30 MED ORDER — FENTANYL CITRATE (PF) 100 MCG/2ML IJ SOLN
INTRAMUSCULAR | Status: DC | PRN
  Administered 2016-09-30: 12.5 ug via INTRAVENOUS
  Administered 2016-09-30: 25 ug via INTRAVENOUS
  Administered 2016-09-30: 12.5 ug via INTRAVENOUS

## 2016-09-30 MED ORDER — BUPIVACAINE HCL (PF) 0.25 % IJ SOLN
INTRAMUSCULAR | Status: DC | PRN
  Administered 2016-09-30: 20 mL

## 2016-09-30 MED ORDER — FUROSEMIDE 40 MG PO TABS: 40.0000 mg | ORAL_TABLET | Freq: Every day | ORAL | Status: DC

## 2016-09-30 MED ORDER — IOPAMIDOL (ISOVUE-370) INJECTION 76%
INTRAVENOUS | Status: AC
Start: 2016-09-30 — End: 2016-09-30
  Filled 2016-09-30: qty 125

## 2016-09-30 MED ORDER — METOPROLOL SUCCINATE ER 50 MG PO TB24
50.0000 mg | ORAL_TABLET | Freq: Two times a day (BID) | ORAL | Status: DC
  Administered 2016-10-01: 10:00:00 50 mg via ORAL
  Filled 2016-09-30: qty 1

## 2016-09-30 MED ORDER — DILTIAZEM HCL ER COATED BEADS 120 MG PO CP24
120.0000 mg | ORAL_CAPSULE | Freq: Two times a day (BID) | ORAL | Status: DC
Start: 2016-10-01 — End: 2016-10-01
  Filled 2016-09-30: qty 1

## 2016-09-30 MED ORDER — FENTANYL CITRATE (PF) 100 MCG/2ML IJ SOLN
INTRAMUSCULAR | Status: AC
  Filled 2016-09-30: qty 2

## 2016-09-30 MED ORDER — SODIUM CHLORIDE 0.9 % IV SOLN: 250.0000 mL | INTRAVENOUS | Status: DC | PRN

## 2016-09-30 MED ORDER — MIDAZOLAM HCL 10 MG/2ML IJ SOLN
INTRAMUSCULAR | Status: DC | PRN
  Administered 2016-09-30 (×2): 2 mg via INTRAVENOUS
  Administered 2016-09-30: 1 mg via INTRAVENOUS

## 2016-09-30 MED ORDER — ONDANSETRON HCL 4 MG/2ML IJ SOLN: 4.0000 mg | Freq: Four times a day (QID) | INTRAMUSCULAR | Status: DC | PRN

## 2016-09-30 MED ORDER — IOPAMIDOL (ISOVUE-370) INJECTION 76%
INTRAVENOUS | Status: DC | PRN
  Administered 2016-09-30: 101 mL

## 2016-09-30 MED ORDER — HEPARIN SODIUM (PORCINE) 1000 UNIT/ML IJ SOLN
INTRAMUSCULAR | Status: DC | PRN
  Administered 2016-09-30: 3000 [IU] via INTRAVENOUS
  Administered 2016-09-30 (×2): 4000 [IU] via INTRAVENOUS

## 2016-09-30 MED ORDER — HEPARIN SODIUM (PORCINE) 1000 UNIT/ML IJ SOLN
INTRAMUSCULAR | Status: AC
  Filled 2016-09-30: qty 1

## 2016-09-30 MED ORDER — BUPIVACAINE HCL (PF) 0.25 % IJ SOLN
INTRAMUSCULAR | Status: AC
  Filled 2016-09-30: qty 30

## 2016-09-30 SURGICAL SUPPLY — 23 items
BAG SNAP BAND KOVER 36X36 (MISCELLANEOUS) ×2 IMPLANT
BLANKET WARM UNDERBOD FULL ACC (MISCELLANEOUS) ×2 IMPLANT
CATH DIAG 6FR PIGTAIL (CATHETERS) ×2 IMPLANT
CATH NAVISTAR SMARTTOUCH DF (ABLATOR) ×2 IMPLANT
CATH SOUNDSTAR 3D IMAGING (CATHETERS) ×2 IMPLANT
CATH VARIABLE LASSO NAV 2515 (CATHETERS) ×2 IMPLANT
CATH WEBSTER BI DIR CS D-F CRV (CATHETERS) ×2 IMPLANT
COVER SWIFTLINK CONNECTOR (BAG) ×2 IMPLANT
NEEDLE TRANSEP BRK 71CM 407200 (NEEDLE) ×2 IMPLANT
PACK EP LATEX FREE (CUSTOM PROCEDURE TRAY) ×1
PACK EP LF (CUSTOM PROCEDURE TRAY) ×1 IMPLANT
PAD DEFIB LIFELINK (PAD) ×2 IMPLANT
PATCH CARTO3 (PAD) ×2 IMPLANT
SHEATH AVANTI 11F 11CM (SHEATH) ×2 IMPLANT
SHEATH PINNACLE 7F 10CM (SHEATH) ×4 IMPLANT
SHEATH PINNACLE 9F 10CM (SHEATH) ×2 IMPLANT
SHEATH SWARTZ TS SL2 63CM 8.5F (SHEATH) ×2 IMPLANT
SHIELD RADPAD SCOOP 12X17 (MISCELLANEOUS) ×2 IMPLANT
SYR MEDRAD MARK V 150ML (SYRINGE) ×2 IMPLANT
TUBING ART PRESS 72  MALE/FEM (TUBING) ×1
TUBING ART PRESS 72 MALE/FEM (TUBING) ×1 IMPLANT
TUBING CONTRAST HIGH PRESS 48 (TUBING) ×2 IMPLANT
TUBING SMART ABLATE COOLFLOW (TUBING) ×2 IMPLANT

## 2016-09-30 NOTE — Progress Notes (Addendum)
Site area: RFV x 3 Site Prior to Removal:  Level 0 Pressure Applied For:20 min Manual:   yes Patient Status During Pull:  stable Post Pull Site:  Level 0 Post Pull Instructions Given: yes  Post Pull Pulses Present: palpable Dressing Applied:  tegaderm Bedrest begins @ 1600 till 2200 Comments:

## 2016-09-30 NOTE — H&P (Signed)
Larry Evans is a 51 y.o. male with a h/o persistent atrial fibrillation, ETOh abuse, and sleep apnea (compliant with CPAP) who presents for afib ablation.  He reports initially being diagnosed with atrial fibrillation 12/14, though in retrospect, he has had palpitations "for years".  He reports symptoms of fatigue and decreased exercise tolerance with his afib.    He underwent cardioversion 10/05/13.  He reports significant improvement in exercise tolerance and fatigue but has unfortunately returned to afib.  He has a h/o heavy ETOH but has significantly "cut back".  He has also been diagnosed with OSA and uses his CPAP. He was evaluated by me and was placed on tikosyn 4/15.  He did well for about a year.  Recently, he developed palpitations for which his Phyllis Ginger was stopped by [employer] 08/12/16.  He has returned to afib.  He has been seen in the AF clinic and now presents for EP follow-up.  Today, he denies symptoms of chest pain, shortness of breath, orthopnea, PND, lower extremity edema, dizziness, presyncope, syncope, or neurologic sequela. The patient is tolerating medications without difficulties and is otherwise without complaint today.       Past Medical History:  Diagnosis Date  . Non-ischemic cardiomyopathy ([place])    a. 09/2013 Echo: EF 35-40%;  b. 02/2014 Echo: EF 55-60%.  . Obstructive sleep apnea    a. uses CPAP  . Persistent atrial fibrillation ([place])    a. dx 08/2013 -> Apixaban started 08/2013 (CHA2DS2VASc = 1);  b. 09/2013 DCCV;  c. 12/2013 Tikosyn initiated for recurrent AF and DCCV performed.        Past Surgical History:  Procedure Laterality Date  . CARDIOVERSION N/A 10/05/2013   Procedure: CARDIOVERSION;  Surgeon: Fay Records, MD;  Location: [facility] ENDOSCOPY;  Service: Cardiovascular;  Laterality: N/A;  12:35       elective cardioversion, Propofol  60  mg, IV adminiter by Dr. Gwenlyn Fudge, anesthesia...synch 200 joules by Dr. Harrington Challenger.....converted to NSR....  .  CARDIOVERSION N/A 12/28/2013   Procedure: CARDIOVERSION BEDSIDE;  Surgeon: Thayer Headings, MD;  Location: [facility] OR;  Service: Cardiovascular;  Laterality: N/A;  . HERNIA REPAIR            Current Outpatient Prescriptions  Medication Sig Dispense Refill  . apixaban (ELIQUIS) 5 MG TABS tablet TAKE 1 TABLET (5 MG TOTAL) BY MOUTH 2 (TWO) TIMES DAILY. 60 tablet 5  . diltiazem (CARDIZEM CD) 120 MG 24 hr capsule Take 1 capsule (120 mg total) by mouth 2 (two) times daily. 60 capsule 3  . furosemide (LASIX) 20 MG tablet TAKE 1 TABLET BY MOUTH AS NEEDED FOR FLUID 30 tablet 0  . metoprolol succinate (TOPROL-XL) 50 MG 24 hr tablet Take 1 tablet (50 mg total) by mouth daily. 30 tablet 6   No current facility-administered medications for this visit.     No Known Allergies  Social History        Social History  . Marital status: Divorced    Spouse name: N/A  . Number of children: 1  . Years of education: N/A        Occupational History  .      Programmer, applications         Social History Main Topics  . Smoking status: Former Smoker    Packs/day: 0.25    Years: 5.00    Types: Cigarettes    Quit date: 10/05/2012  . Smokeless tobacco: Never Used  . Alcohol use 1.2 oz/week  2 Cans of beer per week     Comment: heavy ETOH /// 6pk /week(07/27/16)  . Drug use: No  . Sexual activity: Yes    Birth control/ protection: Condom       Other Topics Concern  . Not on file      Social History Narrative  . No narrative on file          Family History  Problem Relation Age of Onset  . Lung cancer Maternal Grandmother   . Throat cancer Maternal Grandfather   . Heart disease      No family history    ROS- All systems are reviewed and negative except as per the HPI above  PE: vitals reviewed GEN- The patient is well appearing, alert and oriented x 3 today.   Head- normocephalic, atraumatic Eyes-  Sclera clear, conjunctiva pink Ears- hearing  intact Oropharynx- clear Neck- supple,   Lungs- Clear to ausculation bilaterally, normal work of breathing Heart- irregular rate and rhythm, no murmurs, rubs or gallops, PMI not laterally displaced GI- soft, NT, ND, + BS Extremities- no clubbing, cyanosis, or edema MS- no significant deformity or atrophy Skin- no rash or lesion Psych- euthymic mood, full affect Neuro- strength and sensation are intact   Assessment and Plan:  1. Persistent afib The patient has persistent symptomatic atrial fibrillation.  He has failed medical therapy with tikosyn.  Lifestyle modification is encouraged.  He has worked hard at this. Therapeutic strategies for afib including medicine and ablation were discussed in detail with the patient today. Risk, benefits, and alternatives to EP study and radiofrequency ablation for afib were also discussed in detail today. These risks include but are not limited to stroke, bleeding, vascular damage, tamponade, perforation, damage to the esophagus, lungs, and other structures, pulmonary vein stenosis, worsening renal function, and death. The patient understands these risk and wishes to proceed.  We will therefore proceed with catheter ablation.  Risks of TEE discussed with patient as well. HE reports compliance with eliquis without interuption.  2. OSA Compliance with cpap encouraged  3. ETOH Cessation of ETOH is advised  Thompson Grayer MD, [facility] 09/30/2016 8:19 AM

## 2016-09-30 NOTE — Progress Notes (Signed)
  Echocardiogram Echocardiogram Transesophageal has been performed.  Larry Evans 09/30/2016, 9:57 AM

## 2016-09-30 NOTE — Anesthesia Postprocedure Evaluation (Signed)
Anesthesia Post Note  Patient: Larry Evans  Procedure(s) Performed: Procedure(s) (LRB): Atrial Fibrillation Ablation (N/A)  Patient location during evaluation: Cath Lab Anesthesia Type: General Level of consciousness: awake, awake and alert and oriented Pain management: pain level controlled Vital Signs Assessment: post-procedure vital signs reviewed and stable Respiratory status: spontaneous breathing, nonlabored ventilation and respiratory function stable Cardiovascular status: blood pressure returned to baseline Anesthetic complications: no       Last Vitals:  Vitals:   09/30/16 1940 09/30/16 2000  BP: 119/82 118/87  Pulse: 90 86  Resp: (!) 22 20  Temp: 36.3 C     Last Pain:  Vitals:   09/30/16 1940  TempSrc: Oral  PainSc:                  [name]

## 2016-09-30 NOTE — Transfer of Care (Signed)
Immediate Anesthesia Transfer of Care Note  Patient: Larry Evans  Procedure(s) Performed: Procedure(s): Atrial Fibrillation Ablation (N/A)  Patient Location: PACU  Anesthesia Type:General  Level of Consciousness: awake, alert , oriented and patient cooperative  Airway & Oxygen Therapy: Patient Spontanous Breathing and Patient connected to face mask oxygen  Post-op Assessment: Report given to RN and Post -op Vital signs reviewed and stable  Post vital signs: Reviewed  Last Vitals: 96/61, 80, 23, 98% Vitals:   09/30/16 0945 09/30/16 0950  BP: 107/75 (!) 127/109  Pulse: 93 (!) 43  Resp: (!) 22 18  Temp:      Last Pain:  Vitals:   09/30/16 0832  TempSrc: Oral         Complications: No apparent anesthesia complications

## 2016-09-30 NOTE — Op Note (Signed)
LA, LAA without masses  Some spontaneous contrast swirling in LAA that clears.  Appendage is large RA normal TV normal  Mild TR MV normal  MIld MR AV normal  No AI LVEF is severely depressed RVEF is mildly depressed Miinimal fixed plaquing of the thoracic aorta

## 2016-09-30 NOTE — Anesthesia Procedure Notes (Signed)
Procedure Name: LMA Insertion Date/Time: 09/30/2016 10:47 AM Performed by: Marchelle Folks ANN Pre-anesthesia Checklist: Patient identified, Suction available, Emergency Drugs available, Patient being monitored and Timeout performed Patient Re-evaluated:Patient Re-evaluated prior to inductionOxygen Delivery Method: Circle system utilized Preoxygenation: Pre-oxygenation with 100% oxygen Intubation Type: IV induction LMA: LMA inserted LMA Size: 4.0 Number of attempts: 1 Tube secured with: Tape Dental Injury: Teeth and Oropharynx as per pre-operative assessment

## 2016-09-30 NOTE — Interval H&P Note (Signed)
History and Physical Interval Note:  09/30/2016 8:34 AM  Larry Evans  has presented today for surgery, with the diagnosis of AFIB  The various methods of treatment have been discussed with the patient and family. After consideration of risks, benefits and other options for treatment, the patient has consented to  Procedure(s): TRANSESOPHAGEAL ECHOCARDIOGRAM (TEE) (N/A) as a surgical intervention .  The patient's history has been reviewed, patient examined, no change in status, stable for surgery.  I have reviewed the patient's chart and labs.  Questions were answered to the patient's satisfaction.     Dorris Carnes

## 2016-09-30 NOTE — Discharge Summary (Signed)
ELECTROPHYSIOLOGY PROCEDURE DISCHARGE SUMMARY    Patient ID: Larry Evans,  MRN: [id number], DOB/AGE: Nov 15, 1965 51 y.o.  Admit date: 09/30/2016 Discharge date: 10/01/16  Primary Care Physician: Aretta Nip, MD Primary Cardiologist: Dr. Stanford Breed Electrophysiologist: Thompson Grayer, MD  Primary Discharge Diagnosis:  1. Persistent AFib     CHA2DS2Vasc is 2 on Eliquis  Secondary Discharge Diagnosis:  1. NICM 2. OSA uses CPAP 3. ETOH use     The patient has been counseled  Procedures This Admission:  1.  Electrophysiology study and radiofrequency catheter ablation on 09/30/16 by Dr Thompson Grayer.   This study demonstrated  CONCLUSIONS: 1. Atrial fibrillation upon presentation.   2. Rotational Angiography reveals a very large sized left atrium with four separate pulmonary veins without evidence of pulmonary vein stenosis. 3. Successful electrical isolation and anatomical encircling of all four pulmonary veins with radiofrequency current.  A WACA approach was used 3. Additional left atrial ablation was performed with a standard box lesion created along the posterior wall of the left atrium 4. Atrial fibrillation successfully cardioverted to sinus rhythm. 5. No early apparent complications.  Brief HPI: Larry Evans is a 51 y.o. male with a history of persistent atrial fibrillation.  They have failed medical therapy withTikosyn. Risks, benefits, and alternatives to catheter ablation of atrial fibrillation were reviewed with the patient who wished to proceed.  The patient underwent TEE prior to the procedure which demonstrated LVEF severely depressed and no LAA thrombus.    Hospital Course:  The patient was admitted and underwent EPS/RFCA of atrial fibrillation with details as outlined above.  They were monitored on telemetry overnight which demonstrated SR.  Groin was without complication on the day of discharge.  The patient was examined by Dr. Rayann Heman and considered to  be stable for discharge.  Wound care and restrictions were reviewed with the patient.  The patient will be seen back by Roderic Palau, NP in 4 weeks and Dr Rayann Heman in 12 weeks for post ablation follow up. The patient has some post procedure chest discomfort, discussed this is not uncommon for the type of procedure, recommend Tylenol though he requests Vicodin, in d/w Dr. Rayann Heman, [facility] to write, the patient does not have any current or recent narcotic Rx in [facility] system, will provide  Vicodin 5/325mg  Q6 hours PRN for moderate pain, #5 tabs no refills.    Physical Exam: Vitals:   10/01/16 0420 10/01/16 0700 10/01/16 0800 10/01/16 0857  BP: 121/77 120/83 117/79 (!) 127/92  Pulse: 90 90 92   Resp: 16 19 20    Temp: 97.6 F (36.4 C) 98.3 F (36.8 C)    TempSrc: Axillary Oral    SpO2: 95% 97% 95%   Weight: 265 lb 3.4 oz (120.3 kg)     Height:         GEN- The patient is well appearing, alert and oriented x 3 today.   HEENT: normocephalic, atraumatic; sclera clear, conjunctiva pink; hearing intact; oropharynx clear; neck supple  Lungs- CTA b/l, normal work of breathing.  No wheezes, rales, rhonchi Heart- RRR , no murmurs, rubs or gallops  GI- soft, non-tender, non-distended Extremities- no clubbing, cyanosis, or edema; DP/PT/radial pulses 2+ bilaterally, groin without hematoma/bruit MS- no significant deformity or atrophy Skin- warm and dry, no rash or lesion Psych- euthymic mood, full affect Neuro- strength and sensation are intact    Labs:   Lab Results  Component Value Date   WBC 6.2 09/30/2016   HGB 15.9 09/30/2016  HCT 46.7 09/30/2016   MCV 85.4 09/30/2016   PLT 184 09/30/2016     Recent Labs Lab 10/01/16 0218  NA 137  K 4.0  CL 103  CO2 24  BUN 12  CREATININE 1.21  CALCIUM 8.1*  GLUCOSE 142*     Discharge Medications:  Allergies as of 10/01/2016   No Known Allergies     Medication List    STOP taking these medications   diltiazem 120 MG 24 hr  capsule Commonly known as:  CARDIZEM CD     TAKE these medications   apixaban 5 MG Tabs tablet Commonly known as:  ELIQUIS TAKE 1 TABLET (5 MG TOTAL) BY MOUTH 2 (TWO) TIMES DAILY.   colchicine 0.6 MG tablet Take 1.2 mg by mouth daily as needed (gout flare).   furosemide 20 MG tablet Commonly known as:  LASIX TAKE 1 TABLET BY MOUTH AS NEEDED FOR FLUID   HYDROcodone-acetaminophen 5-325 MG tablet Commonly known as:  NORCO/VICODIN Take 1 tablet by mouth every 6 (six) hours as needed for moderate pain.   metoprolol succinate 50 MG 24 hr tablet Commonly known as:  TOPROL-XL Take 1 tablet (50 mg total) by mouth 2 (two) times daily.   pantoprazole 40 MG tablet Commonly known as:  PROTONIX Take 1 tablet (40 mg total) by mouth daily.       Disposition:  Home Discharge Instructions    Diet - low sodium heart healthy    Complete by:  As directed    Increase activity slowly    Complete by:  As directed      Follow-up Information    [facility] Follow up on 11/01/2016.   Specialty:  Cardiology Why:  3:00PM Contact information: [address] [id number] mc [place] [phone]       Thompson Grayer, MD Follow up on 12/27/2016.   Specialty:  Cardiology Why:  9:30AM Contact information: [place] [address] [phone]           Duration of Discharge Encounter: Greater than 30 minutes including physician time.  Signed, Tommye Standard, PA-C 10/01/2016 10:55 AM   I have seen, examined the patient, and reviewed the above assessment and plan.  Changes to above are made where necessary.  On exam, VSS. RRR, NAD.  Will discharge to home with close outpatient follow-up. Importance of lifestyle modifications was stressed to the patient.  Co Sign: Thompson Grayer, MD 10/01/2016 6:25 PM

## 2016-09-30 NOTE — Anesthesia Preprocedure Evaluation (Addendum)
Anesthesia Evaluation  Patient identified by MRN, date of birth, ID band Patient awake    Reviewed: Allergy & Precautions, NPO status , Patient's Chart, lab work & pertinent test results  Airway Mallampati: III  TM Distance: >3 FB Neck ROM: Full    Dental  (+) Teeth Intact, Dental Advisory Given   Pulmonary former smoker,    breath sounds clear to auscultation       Cardiovascular  Rhythm:Irregular Rate:Normal     Neuro/Psych    GI/Hepatic   Endo/Other    Renal/GU      Musculoskeletal   Abdominal   Peds  Hematology   Anesthesia Other Findings   Reproductive/Obstetrics                             Anesthesia Physical Anesthesia Plan  ASA: III  Anesthesia Plan: General and MAC   Post-op Pain Management:    Induction: Intravenous  Airway Management Planned: LMA  Additional Equipment:   Intra-op Plan:   Post-operative Plan:   Informed Consent: I have reviewed the patients History and Physical, chart, labs and discussed the procedure including the risks, benefits and alternatives for the proposed anesthesia with the patient or authorized representative who has indicated his/her understanding and acceptance.   Dental advisory given  Plan Discussed with: CRNA and Anesthesiologist  Anesthesia Plan Comments: (Plan MAC for TEE and GA with LMA for the ablation  Roberts Gaudy)       Anesthesia Quick Evaluation

## 2016-10-01 ENCOUNTER — Encounter (HOSPITAL_COMMUNITY): Payer: Self-pay | Admitting: Internal Medicine

## 2016-10-01 DIAGNOSIS — I481 Persistent atrial fibrillation: Secondary | ICD-10-CM | POA: Diagnosis not present

## 2016-10-01 LAB — BASIC METABOLIC PANEL
ANION GAP: 10 (ref 5–15)
BUN: 12 mg/dL (ref 6–20)
CALCIUM: 8.1 mg/dL — AB (ref 8.9–10.3)
CO2: 24 mmol/L (ref 22–32)
Chloride: 103 mmol/L (ref 101–111)
Creatinine, Ser: 1.21 mg/dL (ref 0.61–1.24)
GFR calc non Af Amer: >60 mL/min (ref >60)
GLUCOSE: 142 mg/dL — AB (ref 65–99)
Potassium: 4 mmol/L (ref 3.5–5.1)
Sodium: 137 mmol/L (ref 135–145)

## 2016-10-01 MED ORDER — PANTOPRAZOLE SODIUM 40 MG PO TBEC: 40.0000 mg | DELAYED_RELEASE_TABLET | Freq: Every day | ORAL | 0 refills | Status: DC

## 2016-10-01 MED ORDER — OFF THE BEAT BOOK
Freq: Once | Status: DC
  Filled 2016-10-01: qty 1

## 2016-10-01 MED ORDER — HYDROCODONE-ACETAMINOPHEN 5-325 MG PO TABS: 1.0000 | ORAL_TABLET | Freq: Four times a day (QID) | ORAL | 0 refills | Status: DC | PRN

## 2016-10-01 NOTE — Care Management Note (Signed)
Case Management Note  Patient Details  Name: CAJUN BAYERS MRN: [id number] Date of Birth: 1966-09-03  Subjective/Objective:    S/p afib ablation, already on eliquis, for dc today, no needs.                Action/Plan:   Expected Discharge Date:  10/01/16               Expected Discharge Plan:  Home/Self Care  In-House Referral:     Discharge planning Services  CM Consult  Post Acute Care Choice:    Choice offered to:     DME Arranged:    DME Agency:     HH Arranged:    HH Agency:     Status of Service:  Completed, signed off  If discussed at [employer] of Stay Meetings, dates discussed:    Additional Comments:  Zenon Mayo, RN 10/01/2016, 11:45 AM

## 2016-10-01 NOTE — Progress Notes (Signed)
Patients wife verbalized concern about swelling in patients scrotum. Discussed on the phone with Dr. Rayann Heman and reviewed information with patient and wife. Dr. Rayann Heman was not concerned but would have his PA Renee come and see patient. Patient and wife both verbalized not being concerned after discussion. Discharged to home.

## 2016-10-01 NOTE — Progress Notes (Signed)
Discharge instructions given. Pt verbalized understanding and all questions were answered.  

## 2016-10-05 ENCOUNTER — Telehealth: Payer: Self-pay | Admitting: Internal Medicine

## 2016-10-05 NOTE — Telephone Encounter (Signed)
The patient had an ablation on 09/30/16. The patient states that he has deep purple almost black bruising that starts  2 inches above his insertion site that extends into his scrotal area. He states that he has a half dollar size hard spot that is in his scrotal area and not at the insertion site. He states that it is not painful and that he has good circulation in his extremities. Patient was advised that bruising in the area is normal and to continue to monitor and let us know if the area becomes painful, warm, or inflamed, if the hard spot becomes larger, or if he develops any numbness or tingling in his foot or if his foot becomes pale or colder than the other foot. Patient verbalized understanding. Message routed to Dr. Rayann Heman and his RN for review.

## 2016-10-05 NOTE — Telephone Encounter (Signed)
Called to check on patient. States he left the hospital with scrotal swelling but it has steadily increased in size. He feels like it has "leveled off" but still concerning the amount of swelling/bruising noted. He is not having trouble urinating. Instructed pt to try to keep scrotum elevated as often as he can. He would prefer to be checked out with the amount of swelling. Appt made for 1/31. Pt agrees with plan.

## 2016-10-05 NOTE — Telephone Encounter (Signed)
Patient states he had an ablation last Thursday and has bruising in his private area --he feels this is getting worse.  Please call.

## 2016-10-05 NOTE — Telephone Encounter (Signed)
Stacy,  Please follow-up with him. 

## 2016-10-06 ENCOUNTER — Ambulatory Visit (HOSPITAL_COMMUNITY)
Admission: RE | Admit: 2016-10-06 | Discharge: 2016-10-06 | Disposition: A | Discharge status: Home / Self Care | Payer: Managed Care, Other (non HMO) | Source: Ambulatory Visit | Attending: Nurse Practitioner | Admitting: Nurse Practitioner

## 2016-10-06 ENCOUNTER — Encounter (HOSPITAL_COMMUNITY): Payer: Self-pay | Admitting: Nurse Practitioner

## 2016-10-06 VITALS — BP 142/96 | HR 82 | Ht 73.0 in | Wt 254.4 lb

## 2016-10-06 DIAGNOSIS — I429 Cardiomyopathy, unspecified: Secondary | ICD-10-CM | POA: Diagnosis not present

## 2016-10-06 DIAGNOSIS — Z7901 Long term (current) use of anticoagulants: Secondary | ICD-10-CM | POA: Insufficient documentation

## 2016-10-06 DIAGNOSIS — G4733 Obstructive sleep apnea (adult) (pediatric): Secondary | ICD-10-CM | POA: Diagnosis not present

## 2016-10-06 DIAGNOSIS — Z79899 Other long term (current) drug therapy: Secondary | ICD-10-CM | POA: Diagnosis not present

## 2016-10-06 DIAGNOSIS — Z9889 Other specified postprocedural states: Secondary | ICD-10-CM | POA: Insufficient documentation

## 2016-10-06 DIAGNOSIS — Z87891 Personal history of nicotine dependence: Secondary | ICD-10-CM | POA: Diagnosis not present

## 2016-10-06 DIAGNOSIS — Z8249 Family history of ischemic heart disease and other diseases of the circulatory system: Secondary | ICD-10-CM | POA: Insufficient documentation

## 2016-10-06 DIAGNOSIS — Z800 Family history of malignant neoplasm of digestive organs: Secondary | ICD-10-CM | POA: Insufficient documentation

## 2016-10-06 DIAGNOSIS — Z801 Family history of malignant neoplasm of trachea, bronchus and lung: Secondary | ICD-10-CM | POA: Insufficient documentation

## 2016-10-06 DIAGNOSIS — I481 Persistent atrial fibrillation: Secondary | ICD-10-CM | POA: Diagnosis not present

## 2016-10-06 NOTE — Addendum Note (Signed)
Encounter addended by: Sherran Needs, NP on: 10/06/2016  9:53 AM<BR>    Actions taken: Sign clinical note

## 2016-10-06 NOTE — Progress Notes (Addendum)
Primary Care Physician: Aretta Nip, MD Referring Physician: Dr. Levy Pupa Larry Evans is a 51 y.o. male in the atrial fibrillation clinic for evaluation. He was placed on tikosyn in April 2015 and with cardioversion converted to SR.With maintenance of SR, his EF improved to 55%.  He did well for around a year in SR. He had an insurance change this spring and reduced his amount of tikosyn with approval of Dr. Rayann Heman, to 250 mcg bid. He noticed increased HR around October of this year with associated shortness of breath and abdominal bloating, at which time his PCP started lasix, toprol and resumed eliquis. He did lose 10 lbs with diuretic. Pt increased tikosyn on his back to 375 mcg bid. He described two episodes of severe tachycardia associated with diaphoresis.  Tikosyn was stopped when on visit of 12/7 with Kerin Ransom. Cardizem was added.  He remained in afib with RVR. He is symptomatic with exertional dyspnea and fatigue. He was drinking some alcohol but had not had any x 3 weeks. He was drinking minimal caffeine. No regular exercise and is obese with a BMI of 34. He has OSA, using cpap.  F/u ablation 1/25 with Dr. Rayann Heman. Pt has bruising involving rt scrotum and wanted to be checked. He has not had any afib. Feels a little fatigued and mildly winded since the procedure but weight is stable. States scrotum is not painful and swelling appears to have leveled off  the last 1-2 days. No pain in rt leg.  He denies symptoms of palpitations, chest pain, shortness of breath, orthopnea, PND, lower extremity edema, dizziness, presyncope, syncope, or neurologic sequela. The patient is tolerating medications without difficulties and is otherwise without complaint today.   Past Medical History:  Diagnosis Date  . Non-ischemic cardiomyopathy ([place])    a. 09/2013 Echo: EF 35-40%;  b. 02/2014 Echo: EF 55-60%.  . Obstructive sleep apnea    a. uses CPAP  . Persistent atrial fibrillation ([place])      a. dx 08/2013 -> Apixaban started 08/2013 (CHA2DS2VASc = 1);  b. 09/2013 DCCV;  c. 12/2013 Tikosyn initiated for recurrent AF and DCCV performed.   Past Surgical History:  Procedure Laterality Date  . CARDIOVERSION N/A 10/05/2013   Procedure: CARDIOVERSION;  Surgeon: Fay Records, MD;  Location: [facility] ENDOSCOPY;  Service: Cardiovascular;  Laterality: N/A;  12:35       elective cardioversion, Propofol  60  mg, IV adminiter by Dr. Gwenlyn Fudge, anesthesia...synch 200 joules by Dr. Harrington Challenger.....converted to NSR....  . CARDIOVERSION N/A 12/28/2013   Procedure: CARDIOVERSION BEDSIDE;  Surgeon: Thayer Headings, MD;  Location: [facility];  Service: Cardiovascular;  Laterality: N/A;  . ELECTROPHYSIOLOGIC STUDY N/A 09/30/2016   Procedure: Atrial Fibrillation Ablation;  Surgeon: Thompson Grayer, MD;  Location: [facility] CV LAB;  Service: Cardiovascular;  Laterality: N/A;  . HERNIA REPAIR    . TEE WITHOUT CARDIOVERSION N/A 09/30/2016   Procedure: TRANSESOPHAGEAL ECHOCARDIOGRAM (TEE);  Surgeon: Fay Records, MD;  Location: [facility] ENDOSCOPY;  Service: Cardiovascular;  Laterality: N/A;    Current Outpatient Prescriptions  Medication Sig Dispense Refill  . apixaban (ELIQUIS) 5 MG TABS tablet TAKE 1 TABLET (5 MG TOTAL) BY MOUTH 2 (TWO) TIMES DAILY. 60 tablet 5  . metoprolol succinate (TOPROL-XL) 50 MG 24 hr tablet Take 1 tablet (50 mg total) by mouth 2 (two) times daily. 60 tablet 3  . pantoprazole (PROTONIX) 40 MG tablet Take 1 tablet (40 mg total) by mouth daily. 45 tablet  0  . colchicine 0.6 MG tablet Take 1.2 mg by mouth daily as needed (gout flare).   0  . furosemide (LASIX) 20 MG tablet TAKE 1 TABLET BY MOUTH AS NEEDED FOR FLUID (Patient not taking: Reported on 10/06/2016) 30 tablet 0   No current facility-administered medications for this encounter.     No Known Allergies  Social History   Social History  . Marital status: Divorced    Spouse name: N/A  . Number of children: 1  . Years of education: N/A    Occupational History  .      Programmer, applications   Social History Main Topics  . Smoking status: Former Smoker    Packs/day: 0.25    Years: 5.00    Types: Cigarettes    Quit date: 10/05/2012  . Smokeless tobacco: Never Used  . Alcohol use 1.2 oz/week    2 Cans of beer per week     Comment: heavy ETOH /// 6pk /week(07/27/16)  . Drug use: No  . Sexual activity: Yes    Birth control/ protection: Condom   Other Topics Concern  . Not on file   Social History Narrative  . No narrative on file    Family History  Problem Relation Age of Onset  . Lung cancer Maternal Grandmother   . Throat cancer Maternal Grandfather   . Heart disease      No family history    ROS- All systems are reviewed and negative except as per the HPI above  Physical Exam: Vitals:   10/06/16 0908  BP: (!) 142/96  Pulse: 82  Weight: 254 lb 6.4 oz (115.4 kg)  Height: 6\' 1"  (1.854 m)   Wt Readings from Last 3 Encounters:  10/06/16 254 lb 6.4 oz (115.4 kg)  10/01/16 265 lb 3.4 oz (120.3 kg)  08/25/16 239 lb 6.4 oz (108.6 kg)    Labs: Lab Results  Component Value Date   NA 137 10/01/2016   K 4.0 10/01/2016   CL 103 10/01/2016   CO2 24 10/01/2016   GLUCOSE 142 (H) 10/01/2016   BUN 12 10/01/2016   CREATININE 1.21 10/01/2016   CALCIUM 8.1 (L) 10/01/2016   MG 2.1 08/18/2016   Lab Results  Component Value Date   INR 0.85 07/01/2009   No results found for: CHOL, HDL, LDLCALC, TRIG   GEN- The patient is well appearing, alert and oriented x 3 today.   Head- normocephalic, atraumatic Eyes-  Sclera clear, conjunctiva pink Ears- hearing intact Oropharynx- clear Neck- supple, no JVP Lymph- no cervical lymphadenopathy Lungs- Clear to ausculation bilaterally, normal work of breathing Heart- regular rate and rhythm, no murmurs, rubs or gallops, PMI not laterally displaced GI- soft, NT, ND, + BS Extremities- no clubbing,or edema, Some bruising that is clearing rt groin area. No bruit, pulse  present.Rt scrotal area bruised but scrotum is not tense or painful. MS- no significant deformity or atrophy Skin- no rash or lesion Psych- euthymic mood, full affect Neuro- strength and sensation are intact  EKG- not ordered today Epic records reviewed    Assessment and Plan: 1. Persistent Afib S/p ablation 1/25  In SR Off Tikosyn with failure of drug to keep in rhythm  Off cardizem Continue metoprolol Continue apixaban 5 mg bid Continue lasix  2. Bruised  rt scrotum Appears to be WNL of bruising that can be seen after procedure, no pain  Reassured that should be much better over next week If develops pain, increased swelling, redness, notify office  2. Lifestyle issues Continue treatment of OSA with cpap Encouraged exercise and weight loss  F/u afib clinic 2/26 and Dr. Rayann Heman 4/23  Geroge Baseman. Alphons Burgert, [facility] [address] [place],  [address] [phone]

## 2016-11-01 ENCOUNTER — Ambulatory Visit (HOSPITAL_COMMUNITY)
Admission: RE | Admit: 2016-11-01 | Discharge: 2016-11-01 | Disposition: A | Discharge status: Home / Self Care | Payer: Managed Care, Other (non HMO) | Source: Ambulatory Visit | Attending: Nurse Practitioner | Admitting: Nurse Practitioner

## 2016-11-01 ENCOUNTER — Encounter (HOSPITAL_COMMUNITY): Payer: Self-pay | Admitting: Nurse Practitioner

## 2016-11-01 VITALS — BP 160/90 | HR 57 | Ht 73.0 in | Wt 248.0 lb

## 2016-11-01 DIAGNOSIS — Z8249 Family history of ischemic heart disease and other diseases of the circulatory system: Secondary | ICD-10-CM | POA: Diagnosis not present

## 2016-11-01 DIAGNOSIS — Z9889 Other specified postprocedural states: Secondary | ICD-10-CM | POA: Diagnosis not present

## 2016-11-01 DIAGNOSIS — R001 Bradycardia, unspecified: Secondary | ICD-10-CM | POA: Insufficient documentation

## 2016-11-01 DIAGNOSIS — Z87891 Personal history of nicotine dependence: Secondary | ICD-10-CM | POA: Insufficient documentation

## 2016-11-01 DIAGNOSIS — Z801 Family history of malignant neoplasm of trachea, bronchus and lung: Secondary | ICD-10-CM | POA: Insufficient documentation

## 2016-11-01 DIAGNOSIS — Z79899 Other long term (current) drug therapy: Secondary | ICD-10-CM | POA: Insufficient documentation

## 2016-11-01 DIAGNOSIS — Z7901 Long term (current) use of anticoagulants: Secondary | ICD-10-CM | POA: Insufficient documentation

## 2016-11-01 DIAGNOSIS — I481 Persistent atrial fibrillation: Secondary | ICD-10-CM | POA: Insufficient documentation

## 2016-11-01 DIAGNOSIS — Z800 Family history of malignant neoplasm of digestive organs: Secondary | ICD-10-CM | POA: Diagnosis not present

## 2016-11-01 DIAGNOSIS — I429 Cardiomyopathy, unspecified: Secondary | ICD-10-CM | POA: Insufficient documentation

## 2016-11-01 DIAGNOSIS — G4733 Obstructive sleep apnea (adult) (pediatric): Secondary | ICD-10-CM | POA: Insufficient documentation

## 2016-11-01 DIAGNOSIS — I480 Paroxysmal atrial fibrillation: Secondary | ICD-10-CM

## 2016-11-01 DIAGNOSIS — Z9989 Dependence on other enabling machines and devices: Secondary | ICD-10-CM | POA: Diagnosis not present

## 2016-11-01 DIAGNOSIS — I4510 Unspecified right bundle-branch block: Secondary | ICD-10-CM | POA: Insufficient documentation

## 2016-11-01 NOTE — Progress Notes (Signed)
Primary Care Physician: Aretta Nip, MD Referring Physician: Dr. Levy Pupa Larry Evans is a 51 y.o. male in the atrial fibrillation clinic for evaluation. He was placed on tikosyn in April 2015 and with cardioversion converted to SR.With maintenance of SR, his EF improved to 55%.  He did well for around a year in SR. He had an insurance change this spring and reduced his amount of tikosyn with approval of Dr. Rayann Heman, to 250 mcg bid. He noticed increased HR around October of this year with associated shortness of breath and abdominal bloating, at which time his PCP started lasix, toprol and resumed eliquis. He did lose 10 lbs with diuretic. Pt increased tikosyn on his back to 375 mcg bid. He described two episodes of severe tachycardia associated with diaphoresis.  Tikosyn was stopped when on visit of 12/7 with Kerin Ransom. Cardizem was added.  He remained in afib with RVR. He is symptomatic with exertional dyspnea and fatigue. He was drinking some alcohol but had not had any x 3 weeks. He was drinking minimal caffeine. No regular exercise and is obese with a BMI of 34. He has OSA, using cpap.  F/u ablation 1/25 with Dr. Rayann Heman. Pt has bruising involving rt scrotum and wanted to be checked. He has not had any afib. Feels a little fatigued and mildly winded since the procedure but weight is stable. States scrotum is not painful and swelling appears to have leveled off  the last 1-2 days. No pain in rt leg.  F/u in afib clinic now one month out of cardioversion. The scrotum swelling has resolved. He continues without any afib. He denies any swallowing difficulties. Continues on apixaban.  He denies symptoms of palpitations, chest pain, shortness of breath, orthopnea, PND, lower extremity edema, dizziness, presyncope, syncope, or neurologic sequela. The patient is tolerating medications without difficulties and is otherwise without complaint today.   Past Medical History:  Diagnosis Date   . Non-ischemic cardiomyopathy ([place])    a. 09/2013 Echo: EF 35-40%;  b. 02/2014 Echo: EF 55-60%.  . Obstructive sleep apnea    a. uses CPAP  . Persistent atrial fibrillation ([place])    a. dx 08/2013 -> Apixaban started 08/2013 (CHA2DS2VASc = 1);  b. 09/2013 DCCV;  c. 12/2013 Tikosyn initiated for recurrent AF and DCCV performed.   Past Surgical History:  Procedure Laterality Date  . CARDIOVERSION N/A 10/05/2013   Procedure: CARDIOVERSION;  Surgeon: Fay Records, MD;  Location: [facility] ENDOSCOPY;  Service: Cardiovascular;  Laterality: N/A;  12:35       elective cardioversion, Propofol  60  mg, IV adminiter by Dr. Gwenlyn Fudge, anesthesia...synch 200 joules by Dr. Harrington Challenger.....converted to NSR....  . CARDIOVERSION N/A 12/28/2013   Procedure: CARDIOVERSION BEDSIDE;  Surgeon: Thayer Headings, MD;  Location: [place];  Service: Cardiovascular;  Laterality: N/A;  . ELECTROPHYSIOLOGIC STUDY N/A 09/30/2016   Procedure: Atrial Fibrillation Ablation;  Surgeon: Thompson Grayer, MD;  Location: [place] CV LAB;  Service: Cardiovascular;  Laterality: N/A;  . HERNIA REPAIR    . TEE WITHOUT CARDIOVERSION N/A 09/30/2016   Procedure: TRANSESOPHAGEAL ECHOCARDIOGRAM (TEE);  Surgeon: Fay Records, MD;  Location: [facility] ENDOSCOPY;  Service: Cardiovascular;  Laterality: N/A;    Current Outpatient Prescriptions  Medication Sig Dispense Refill  . apixaban (ELIQUIS) 5 MG TABS tablet TAKE 1 TABLET (5 MG TOTAL) BY MOUTH 2 (TWO) TIMES DAILY. 60 tablet 5  . colchicine 0.6 MG tablet Take 1.2 mg by mouth daily as needed (gout  flare).   0  . metoprolol succinate (TOPROL-XL) 50 MG 24 hr tablet Take 1 tablet (50 mg total) by mouth 2 (two) times daily. (Patient taking differently: Take 25 mg by mouth 2 (two) times daily. ) 60 tablet 3  . furosemide (LASIX) 20 MG tablet TAKE 1 TABLET BY MOUTH AS NEEDED FOR FLUID (Patient not taking: Reported on 11/01/2016) 30 tablet 0  . pantoprazole (PROTONIX) 40 MG tablet Take 1 tablet (40 mg total) by mouth daily.  (Patient not taking: Reported on 11/01/2016) 45 tablet 0   No current facility-administered medications for this encounter.     No Known Allergies  Social History   Social History  . Marital status: Divorced    Spouse name: N/A  . Number of children: 1  . Years of education: N/A   Occupational History  .      Programmer, applications   Social History Main Topics  . Smoking status: Former Smoker    Packs/day: 0.25    Years: 5.00    Types: Cigarettes    Quit date: 10/05/2012  . Smokeless tobacco: Never Used  . Alcohol use 1.2 oz/week    2 Cans of beer per week     Comment: heavy ETOH /// 6pk /week(07/27/16)  . Drug use: No  . Sexual activity: Yes    Birth control/ protection: Condom   Other Topics Concern  . Not on file   Social History Narrative  . No narrative on file    Family History  Problem Relation Age of Onset  . Lung cancer Maternal Grandmother   . Throat cancer Maternal Grandfather   . Heart disease      No family history    ROS- All systems are reviewed and negative except as per the HPI above  Physical Exam: Vitals:   11/01/16 1509  BP: (!) 160/90  Pulse: (!) 57  Weight: 248 lb (112.5 kg)  Height: 6\' 1"  (1.854 m)   Wt Readings from Last 3 Encounters:  11/01/16 248 lb (112.5 kg)  10/06/16 254 lb 6.4 oz (115.4 kg)  10/01/16 265 lb 3.4 oz (120.3 kg)    Labs: Lab Results  Component Value Date   NA 137 10/01/2016   K 4.0 10/01/2016   CL 103 10/01/2016   CO2 24 10/01/2016   GLUCOSE 142 (H) 10/01/2016   BUN 12 10/01/2016   CREATININE 1.21 10/01/2016   CALCIUM 8.1 (L) 10/01/2016   MG 2.1 08/18/2016   Lab Results  Component Value Date   INR 0.85 07/01/2009   No results found for: CHOL, HDL, LDLCALC, TRIG   GEN- The patient is well appearing, alert and oriented x 3 today.   Head- normocephalic, atraumatic Eyes-  Sclera clear, conjunctiva pink Ears- hearing intact Oropharynx- clear Neck- supple, no JVP Lymph- no cervical  lymphadenopathy Lungs- Clear to ausculation bilaterally, normal work of breathing Heart- regular rate and rhythm, no murmurs, rubs or gallops, PMI not laterally displaced GI- soft, NT, ND, + BS Extremities- no clubbing,or edema MS- no significant deformity or atrophy Skin- no rash or lesion Psych- euthymic mood, full affect Neuro- strength and sensation are intact  EKG- Sinus brady at 57 bpm, pr int 136 ms, qrs int 96 ms, qtc 434 ms Epic records reviewed   Assessment and Plan: 1. Persistent Afib S/p ablation 1/25  In SR Off Tikosyn with failure of drug to keep in rhythm  Off cardizem Continue metoprolol Continue apixaban 5 mg bid Continue lasix  2. Bruised of  rt scrotum 2/2 abaltion Resolved  2. Lifestyle issues Continue treatment of OSA with cpap Encouraged exercise and weight loss Avoid alcohol  F/u  Dr. Rayann Heman 4/23 afib clinic as needed  Geroge Baseman. Tamisha Nordstrom, [facility] [address] [place], [place] [address] [phone]

## 2016-12-09 ENCOUNTER — Encounter: Payer: Self-pay | Admitting: Internal Medicine

## 2016-12-12 ENCOUNTER — Other Ambulatory Visit: Payer: Self-pay | Admitting: Physician Assistant

## 2016-12-27 ENCOUNTER — Encounter: Payer: Self-pay | Admitting: Internal Medicine

## 2016-12-27 ENCOUNTER — Ambulatory Visit (INDEPENDENT_AMBULATORY_CARE_PROVIDER_SITE_OTHER): Payer: Managed Care, Other (non HMO) | Admitting: Internal Medicine

## 2016-12-27 VITALS — BP 148/84 | HR 55 | Ht 72.0 in | Wt 243.8 lb

## 2016-12-27 DIAGNOSIS — I10 Essential (primary) hypertension: Secondary | ICD-10-CM

## 2016-12-27 DIAGNOSIS — I481 Persistent atrial fibrillation: Secondary | ICD-10-CM | POA: Diagnosis not present

## 2016-12-27 DIAGNOSIS — I4819 Other persistent atrial fibrillation: Secondary | ICD-10-CM

## 2016-12-27 DIAGNOSIS — I519 Heart disease, unspecified: Secondary | ICD-10-CM

## 2016-12-27 MED ORDER — METOPROLOL SUCCINATE ER 50 MG PO TB24: ORAL_TABLET | ORAL | 3 refills | Status: DC

## 2016-12-27 NOTE — Patient Instructions (Signed)
Medication Instructions:  Your physician has recommended you make the following change in your medication:  1) Decrease Toprol XL to 25 mg by mouth daily   Labwork: None ordered   Testing/Procedures: Your physician has requested that you have an echocardiogram. Echocardiography is a painless test that uses sound waves to create images of your heart. It provides your doctor with information about the size and shape of your heart and how well your heart's chambers and valves are working. This procedure takes approximately one hour. There are no restrictions for this procedure. - IN 3 MONTHS    Follow-Up: Your physician recommends that you schedule a follow-up appointment in: 3 months with Dr. Rayann Heman.    Any Other Special Instructions Will Be Listed Below (If Applicable).     If you need a refill on your cardiac medications before your next appointment, please call your pharmacy.

## 2016-12-27 NOTE — Progress Notes (Signed)
Primary Care Physician: Aretta Nip, MD Referring Physician:  Dr Levy Pupa Larry Evans is a 51 y.o. male with a h/o persistent atrial fibrillation, ETOh abuse, and sleep apnea (compliant with CPAP) who presents for EP follow-up. He has done well since his afib ablation, without recurrent episodes.  Denies procedure related complications. Today, he denies symptoms of chest pain, shortness of breath, orthopnea, PND, lower extremity edema, dizziness, presyncope, syncope, or neurologic sequela. The patient is tolerating medications without difficulties and is otherwise without complaint today.   Past Medical History:  Diagnosis Date  . Non-ischemic cardiomyopathy ([place])    a. 09/2013 Echo: EF 35-40%;  b. 02/2014 Echo: EF 55-60%.  . Obstructive sleep apnea    a. uses CPAP  . Persistent atrial fibrillation ([place])    a. dx 08/2013 -> Apixaban started 08/2013 (CHA2DS2VASc = 1);  b. 09/2013 DCCV;  c. 12/2013 Tikosyn initiated for recurrent AF and DCCV performed.   Past Surgical History:  Procedure Laterality Date  . CARDIOVERSION N/A 10/05/2013   Procedure: CARDIOVERSION;  Surgeon: Fay Records, MD;  Location: [facility] ENDOSCOPY;  Service: Cardiovascular;  Laterality: N/A;  12:35       elective cardioversion, Propofol  60  mg, IV adminiter by Dr. Gwenlyn Fudge, anesthesia...synch 200 joules by Dr. Harrington Challenger.....converted to NSR....  . CARDIOVERSION N/A 12/28/2013   Procedure: CARDIOVERSION BEDSIDE;  Surgeon: Thayer Headings, MD;  Location: [place];  Service: Cardiovascular;  Laterality: N/A;  . ELECTROPHYSIOLOGIC STUDY N/A 09/30/2016   Procedure: Atrial Fibrillation Ablation;  Surgeon: Thompson Grayer, MD;  Location: [place] CV LAB;  Service: Cardiovascular;  Laterality: N/A;  . HERNIA REPAIR    . TEE WITHOUT CARDIOVERSION N/A 09/30/2016   Procedure: TRANSESOPHAGEAL ECHOCARDIOGRAM (TEE);  Surgeon: Fay Records, MD;  Location: [facility] ENDOSCOPY;  Service: Cardiovascular;  Laterality: N/A;    Current Outpatient  Prescriptions  Medication Sig Dispense Refill  . apixaban (ELIQUIS) 5 MG TABS tablet TAKE 1 TABLET (5 MG TOTAL) BY MOUTH 2 (TWO) TIMES DAILY. 60 tablet 5  . colchicine 0.6 MG tablet Take 1.2 mg by mouth daily as needed (gout flare).   0  . furosemide (LASIX) 20 MG tablet TAKE 1 TABLET BY MOUTH AS NEEDED FOR FLUID 30 tablet 0  . metoprolol succinate (TOPROL-XL) 50 MG 24 hr tablet Take 1 tablet (50 mg total) by mouth 2 (two) times daily. 60 tablet 3   No current facility-administered medications for this visit.     No Known Allergies  Social History   Social History  . Marital status: Divorced    Spouse name: N/A  . Number of children: 1  . Years of education: N/A   Occupational History  .      Programmer, applications   Social History Main Topics  . Smoking status: Former Smoker    Packs/day: 0.25    Years: 5.00    Types: Cigarettes    Quit date: 10/05/2012  . Smokeless tobacco: Never Used  . Alcohol use 1.2 oz/week    2 Cans of beer per week     Comment: heavy ETOH /// 6pk /week(07/27/16)  . Drug use: No  . Sexual activity: Yes    Birth control/ protection: Condom   Other Topics Concern  . Not on file   Social History Narrative  . No narrative on file    Family History  Problem Relation Age of Onset  . Lung cancer Maternal Grandmother   . Throat cancer Maternal Grandfather   .  Heart disease      No family history    ROS- All systems are reviewed and negative except as per the HPI above  Physical Exam: Vitals:   12/27/16 1000  BP: (!) 148/84  Pulse: (!) 55  SpO2: 98%  Weight: 243 lb 12.8 oz (110.6 kg)  Height: 6' (1.829 m)    GEN- The patient is well appearing, alert and oriented x 3 today.   Head- normocephalic, atraumatic Eyes-  Sclera clear, conjunctiva pink Ears- hearing intact Oropharynx- clear Neck- supple,   Lungs- Clear to ausculation bilaterally, normal work of breathing Heart- regular rate and rhythm, no murmurs, rubs or gallops, PMI not  laterally displaced GI- soft, NT, ND, + BS Extremities- no clubbing, cyanosis, or edema MS- no significant deformity or atrophy Skin- no rash or lesion Psych- euthymic mood, full affect Neuro- strength and sensation are intact  EKG tracing from today is personally reviewed and reveals sinus rhythm 55 bpm, nonspecific ST/T changes  Assessment and Plan:  1. Persistent afib Doing well s/p ablation off of AAD therapy chads2vasc score is 2 (reduced EF and htn) Continue on eliquis  2. OSA Compliance with cpap encouraged  3. ETOH Cessation of ETOH is advised  4. Chronic systolic dysfunction EF by TEE 1/18 revealed EF 20% Likely tachycardia mediated Repeat echo prior to return If EF is still depressed, would add losartan and consider additional workup by Dr Stanford Breed  5. Sinus bradycardia Asymptomatic Reduce toprol to 25mg  daily  Return to see me in 65months with echo  Thompson Grayer, MD

## 2017-02-28 ENCOUNTER — Other Ambulatory Visit: Payer: Self-pay

## 2017-02-28 ENCOUNTER — Ambulatory Visit (HOSPITAL_COMMUNITY): Payer: Managed Care, Other (non HMO) | Attending: Internal Medicine

## 2017-02-28 DIAGNOSIS — I517 Cardiomegaly: Secondary | ICD-10-CM | POA: Insufficient documentation

## 2017-02-28 DIAGNOSIS — I4819 Other persistent atrial fibrillation: Secondary | ICD-10-CM

## 2017-02-28 DIAGNOSIS — I4891 Unspecified atrial fibrillation: Secondary | ICD-10-CM | POA: Diagnosis present

## 2017-02-28 DIAGNOSIS — G4733 Obstructive sleep apnea (adult) (pediatric): Secondary | ICD-10-CM | POA: Insufficient documentation

## 2017-02-28 DIAGNOSIS — I481 Persistent atrial fibrillation: Secondary | ICD-10-CM | POA: Diagnosis not present

## 2017-02-28 DIAGNOSIS — I428 Other cardiomyopathies: Secondary | ICD-10-CM | POA: Insufficient documentation

## 2017-03-01 ENCOUNTER — Telehealth: Payer: Self-pay | Admitting: *Deleted

## 2017-03-01 NOTE — Telephone Encounter (Signed)
Patient informed. 

## 2017-03-01 NOTE — Telephone Encounter (Signed)
-----   Message from Thompson Grayer, MD sent at 03/01/2017  8:40 AM EDT ----- Results reviewed.  Claiborne Billings, please inform pt of result. I will route to primary care also.

## 2017-03-02 ENCOUNTER — Encounter: Payer: Self-pay | Admitting: Internal Medicine

## 2017-03-02 ENCOUNTER — Ambulatory Visit (INDEPENDENT_AMBULATORY_CARE_PROVIDER_SITE_OTHER): Payer: Managed Care, Other (non HMO) | Admitting: Internal Medicine

## 2017-03-02 VITALS — BP 128/80 | HR 55 | Ht 72.0 in | Wt 240.8 lb

## 2017-03-02 DIAGNOSIS — I10 Essential (primary) hypertension: Secondary | ICD-10-CM | POA: Diagnosis not present

## 2017-03-02 DIAGNOSIS — I4819 Other persistent atrial fibrillation: Secondary | ICD-10-CM

## 2017-03-02 DIAGNOSIS — I481 Persistent atrial fibrillation: Secondary | ICD-10-CM | POA: Diagnosis not present

## 2017-03-02 NOTE — Progress Notes (Signed)
PCP: Aretta Nip, MD Primary Cardiologist: Dr Levy Pupa Larry Evans is a 51 y.o. male who presents today for routine electrophysiology followup.  Since last being seen in our clinic, the patient reports doing very well.  He is unaware of afib.  Today, he denies symptoms of palpitations, chest pain, shortness of breath,  lower extremity edema, dizziness, presyncope, or syncope.  The patient is otherwise without complaint today.   Past Medical History:  Diagnosis Date  . Non-ischemic cardiomyopathy ([place])    a. 09/2013 Echo: EF 35-40%;  b. 02/2014 Echo: EF 55-60%.  . Obstructive sleep apnea    a. uses CPAP  . Persistent atrial fibrillation ([place])    a. dx 08/2013 -> Apixaban started 08/2013 (CHA2DS2VASc = 1);  b. 09/2013 DCCV;  c. 12/2013 Tikosyn initiated for recurrent AF and DCCV performed.   Past Surgical History:  Procedure Laterality Date  . CARDIOVERSION N/A 10/05/2013   Procedure: CARDIOVERSION;  Surgeon: Fay Records, MD;  Location: [facility] ENDOSCOPY;  Service: Cardiovascular;  Laterality: N/A;  12:35       elective cardioversion, Propofol  60  mg, IV adminiter by Dr. Gwenlyn Fudge, anesthesia...synch 200 joules by Dr. Harrington Challenger.....converted to NSR....  . CARDIOVERSION N/A 12/28/2013   Procedure: CARDIOVERSION BEDSIDE;  Surgeon: Thayer Headings, MD;  Location: [place];  Service: Cardiovascular;  Laterality: N/A;  . ELECTROPHYSIOLOGIC STUDY N/A 09/30/2016   Procedure: Atrial Fibrillation Ablation;  Surgeon: Thompson Grayer, MD;  Location: [place] CV LAB;  Service: Cardiovascular;  Laterality: N/A;  . HERNIA REPAIR    . TEE WITHOUT CARDIOVERSION N/A 09/30/2016   Procedure: TRANSESOPHAGEAL ECHOCARDIOGRAM (TEE);  Surgeon: Fay Records, MD;  Location: [facility] ENDOSCOPY;  Service: Cardiovascular;  Laterality: N/A;    ROS- all systems are reviewed and negatives except as per HPI above  Current Outpatient Prescriptions  Medication Sig Dispense Refill  . apixaban (ELIQUIS) 5 MG TABS tablet TAKE 1  TABLET (5 MG TOTAL) BY MOUTH 2 (TWO) TIMES DAILY. 60 tablet 5  . colchicine 0.6 MG tablet Take 1.2 mg by mouth daily as needed (gout flare).   0  . furosemide (LASIX) 20 MG tablet Take 20 mg by mouth daily as needed (fluid).     No current facility-administered medications for this visit.     Physical Exam: Vitals:   03/02/17 0921  BP: 128/80  Pulse: (!) 55  SpO2: 97%  Weight: 240 lb 12.8 oz (109.2 kg)  Height: 6' (1.829 m)    GEN- The patient is well appearing, alert and oriented x 3 today.   Head- normocephalic, atraumatic Eyes-  Sclera clear, conjunctiva pink Ears- hearing intact Oropharynx- clear Lungs- Clear to ausculation bilaterally, normal work of breathing Heart- Regular rate and rhythm, no murmurs, rubs or gallops, PMI not laterally displaced GI- soft, NT, ND, + BS Extremities- no clubbing, cyanosis, or edema  EKG tracing ordered today is personally reviewed and shows sinus rhythm 55 bpm, nonspecific ST/ T changes  Assessment and Plan:  1. Persistent afib Doing well post ablation without symptomatic recurrence EF has recovered chads2vasc score is now 1.  He is clear that he wishes to continue eliquis for now.  Given AVERROES data, I think that this is reasonable. He has stopped metoprolol  2. OSA Compliant with CPAP  3. ETOH Avoidance encouraged  4. Chronic systolic dysfunction EF has normalized post ablation  5. Sinus bradycardia Asymptomatic  Now off of metoprolol  Return to see me in 3 months  Jeneen Rinks  Linda Biehn MD, [facility] 03/02/2017 9:23 AM

## 2017-03-02 NOTE — Patient Instructions (Signed)

## 2017-05-27 ENCOUNTER — Ambulatory Visit: Payer: Managed Care, Other (non HMO) | Admitting: Internal Medicine

## 2017-06-20 ENCOUNTER — Ambulatory Visit: Payer: Managed Care, Other (non HMO) | Admitting: Internal Medicine

## 2017-06-21 ENCOUNTER — Encounter: Payer: Self-pay | Admitting: Internal Medicine

## 2018-04-26 IMAGING — DX DG CHEST 2V
2 series · 2 of 2 positions shown · non-contrast
Comparison: 06/30/2009

CLINICAL DATA: Shortness of breath for 1 month

EXAM:
CHEST  2 VIEW

[chest pa]
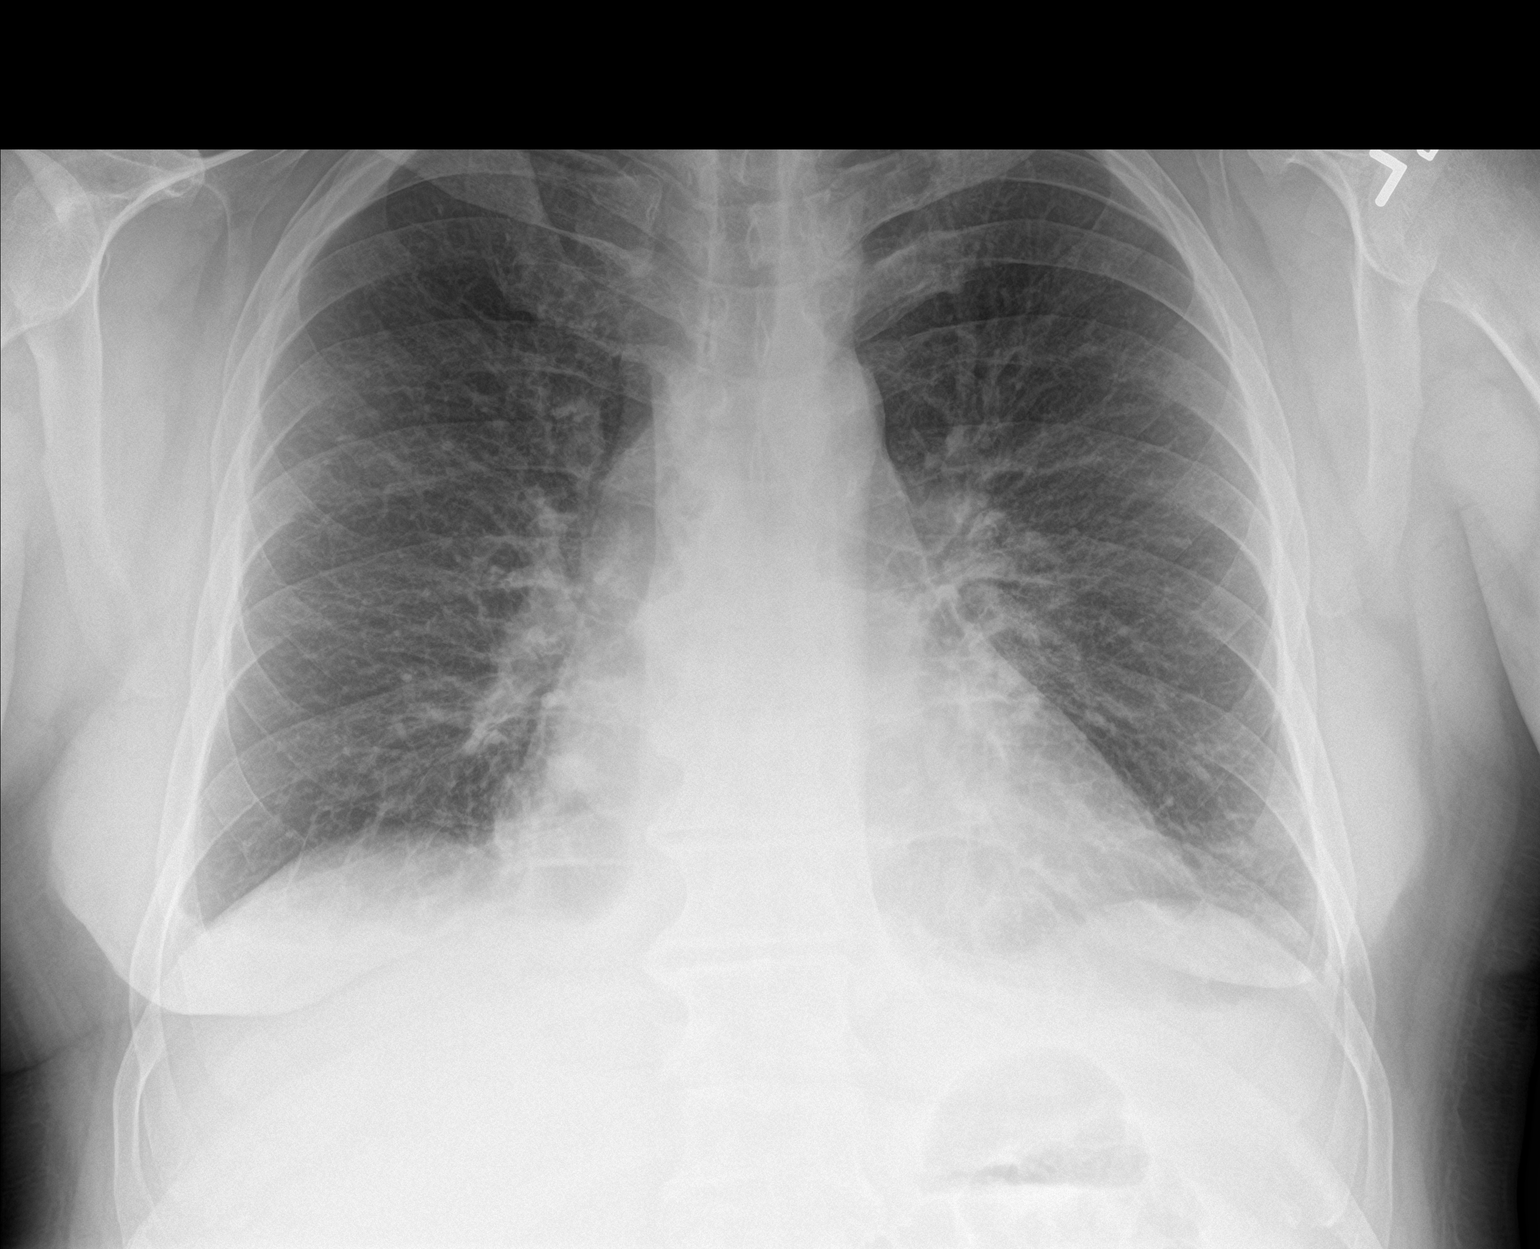

[chest lat]
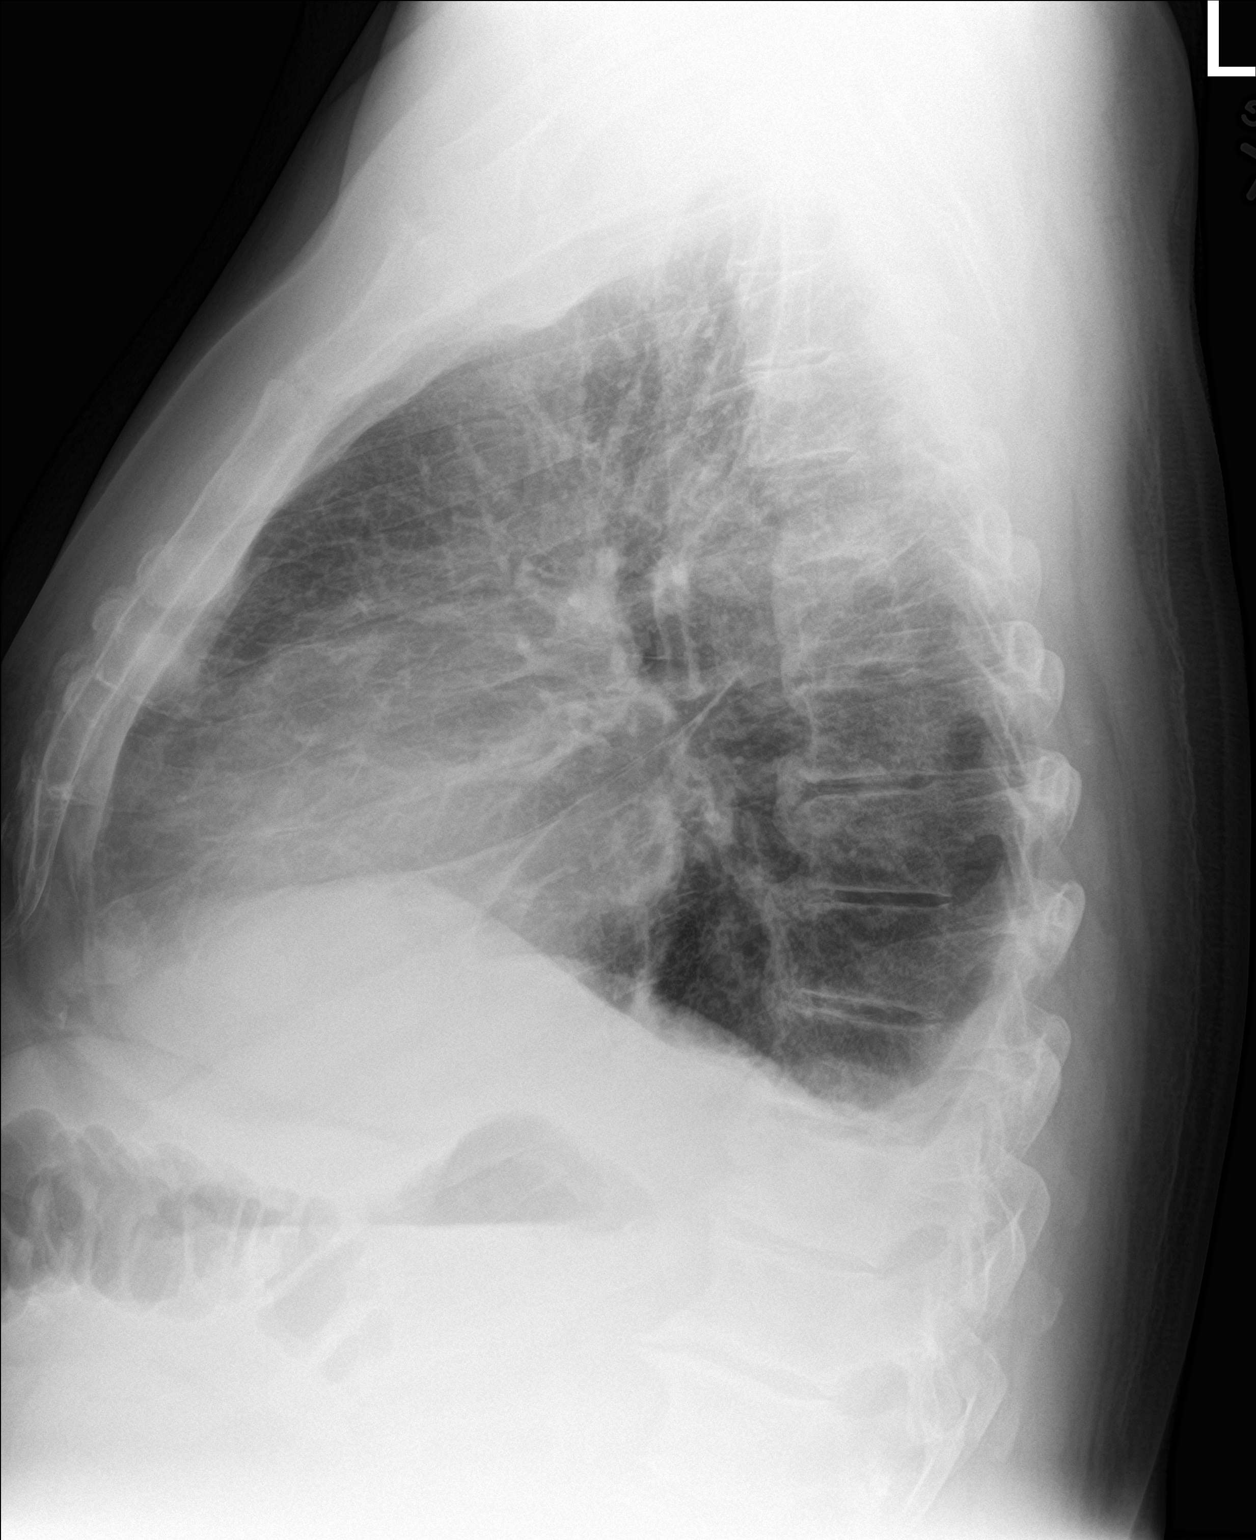

[2 of 2 positions shown; findings below may reference images not displayed]

FINDINGS: Cardiac shadow is at the upper limits of normal in size. The lungs
are well aerated bilaterally. Small pleural effusions are seen
bilaterally in the posterior costophrenic angle. No focal infiltrate
or sizable effusion is seen. No bony abnormality is noted.
IMPRESSION: Small bilateral pleural effusions. The chronicity of these is un
known.

## 2019-09-11 ENCOUNTER — Encounter: Payer: Self-pay | Admitting: Gastroenterology

## 2019-09-27 ENCOUNTER — Encounter: Payer: Self-pay | Admitting: Internal Medicine

## 2019-10-01 ENCOUNTER — Telehealth: Payer: Self-pay

## 2019-10-01 NOTE — Telephone Encounter (Signed)
Error

## 2019-10-02 ENCOUNTER — Ambulatory Visit (INDEPENDENT_AMBULATORY_CARE_PROVIDER_SITE_OTHER): Payer: BC Managed Care – PPO | Admitting: Internal Medicine

## 2019-10-02 ENCOUNTER — Other Ambulatory Visit: Payer: Self-pay

## 2019-10-02 VITALS — BP 154/92 | HR 63 | Temp 97.3°F | Resp 18 | Ht 72.0 in | Wt 249.0 lb

## 2019-10-02 DIAGNOSIS — E6609 Other obesity due to excess calories: Secondary | ICD-10-CM

## 2019-10-02 DIAGNOSIS — G4733 Obstructive sleep apnea (adult) (pediatric): Secondary | ICD-10-CM | POA: Diagnosis not present

## 2019-10-02 DIAGNOSIS — Z1211 Encounter for screening for malignant neoplasm of colon: Secondary | ICD-10-CM

## 2019-10-02 DIAGNOSIS — F1010 Alcohol abuse, uncomplicated: Secondary | ICD-10-CM

## 2019-10-02 DIAGNOSIS — Z6833 Body mass index (BMI) 33.0-33.9, adult: Secondary | ICD-10-CM | POA: Diagnosis not present

## 2019-10-02 DIAGNOSIS — Z125 Encounter for screening for malignant neoplasm of prostate: Secondary | ICD-10-CM

## 2019-10-02 DIAGNOSIS — R030 Elevated blood-pressure reading, without diagnosis of hypertension: Secondary | ICD-10-CM | POA: Diagnosis not present

## 2019-10-02 DIAGNOSIS — Z6830 Body mass index (BMI) 30.0-30.9, adult: Secondary | ICD-10-CM | POA: Diagnosis not present

## 2019-10-02 DIAGNOSIS — Z9989 Dependence on other enabling machines and devices: Secondary | ICD-10-CM

## 2019-10-02 NOTE — Progress Notes (Signed)
Patient ID: Larry Evans, male    DOB: April 15, 1966  MRN: [id number]  CC: Follow-up   Subjective: Larry Evans is a 54 y.o. male who presents to est care at [facility] His concerns today include:  Patient with history of PAF s/p ablation, OSA on CPAP, prolonged QT interval, ETOH  Previous PCP was Dr. Radene Ou at [place].  Last seen 3 yrs ago.  Decided to change because this is closer and because his ex-girlfriend's sister works there and he does not want her knowing his business.  PAF: had ablation.  Taken off Eliquis by cardiologist per pt. he also had history of congestive heart failure with cardiomyopathy, but heart function improves to normal after he had ablation No palpitations, CP or SOB.  Occasional LT ankle edema BP elevated today.  Reports no hx of HTN.  I note however that he had several elevated blood pressure readings back in 2018 upon review of his chart.  He eats a pack of salted sun flower seeds every morning  OSA:  Using his CPAP QOD because "its just uncomfortable."  Also needs a new mask because the one that he has is old and leaks air.  Gets supply through [place].  Denies daytime sleepiness.    ETOH:  Drinks 4-5 mix drinks a day. Whiskey with 7-Up.  Use to drink even more than that a while ago.  His best friend died of cirrhosis from ETOH abuse many years ago.  Obesity: Reports that he gained about 7 to 10 pounds over the holidays.  Admits that he overeats and not getting in any exercise.  He drinks a lot of 7-Up.  HM:  Has c-scope scheduled for end of next mth  Past medical, social, family history and surgical histories reviewed Patient Active Problem List   Diagnosis Date Noted  . A-fib ([place]) 09/30/2016  . Acute CHF ([place]) 08/12/2016  . Anticoagulated 08/12/2016  . Prolonged QT interval 08/12/2016  . Pleural effusion, bilateral 07/28/2016  . Morbid obesity due to excess calories ([place]) 07/28/2016  . SOB (shortness of breath) on exertion 07/27/2016  . OSA  (obstructive sleep apnea) 10/09/2013  . Congestive dilated cardiomyopathy ([place]) 09/27/2013  . PAF (paroxysmal atrial fibrillation) ([place]) 08/31/2013     No current outpatient medications on file prior to visit.   No current facility-administered medications on file prior to visit.    No Known Allergies  Social History   Socioeconomic History  . Marital status: Significant Other    Spouse name: Not on file  . Number of children: 1  . Years of education: 71  . Highest education level: Not on file  Occupational History    Comment: Programmer, applications  Tobacco Use  . Smoking status: Former Smoker    Types: Cigarettes    Quit date: 2000    Years since quitting: 21.0  . Smokeless tobacco: Never Used  Substance and Sexual Activity  . Alcohol use: Yes    Alcohol/week: 20.0 standard drinks    Types: 20 Cans of beer per week  . Drug use: No  . Sexual activity: Yes    Birth control/protection: Condom  Other Topics Concern  . Not on file  Social History Narrative  . Not on file   Social Determinants of Health   Financial Resource Strain:   . Difficulty of Paying Living Expenses: Not on file  Food Insecurity:   . Worried About Charity fundraiser in the Last Year: Not on file  .  Ran Out of Food in the Last Year: Not on file  Transportation Needs:   . Lack of Transportation (Medical): Not on file  . Lack of Transportation (Non-Medical): Not on file  Physical Activity:   . Days of Exercise per Week: Not on file  . Minutes of Exercise per Session: Not on file  Stress:   . Feeling of Stress : Not on file  Social Connections:   . Frequency of Communication with Friends and Family: Not on file  . Frequency of Social Gatherings with Friends and Family: Not on file  . Attends Religious Services: Not on file  . Active Member of Clubs or Organizations: Not on file  . Attends Archivist Meetings: Not on file  . Marital Status: Not on file  Intimate Partner Violence:   .  Fear of Current or Ex-Partner: Not on file  . Emotionally Abused: Not on file  . Physically Abused: Not on file  . Sexually Abused: Not on file    Family History  Problem Relation Age of Onset  . Lung cancer Maternal Grandmother   . Diabetes Maternal Grandmother   . Throat cancer Maternal Grandfather   . Hypertension Mother   . Atrial fibrillation Mother   . Prostate cancer Father     Past Surgical History:  Procedure Laterality Date  . CARDIOVERSION N/A 10/05/2013   Procedure: CARDIOVERSION;  Surgeon: Fay Records, MD;  Location: [facility] ENDOSCOPY;  Service: Cardiovascular;  Laterality: N/A;  12:35       elective cardioversion, Propofol  60  mg, IV adminiter by Dr. Gwenlyn Fudge, anesthesia...synch 200 joules by Dr. Harrington Challenger.....converted to NSR....  . CARDIOVERSION N/A 12/28/2013   Procedure: CARDIOVERSION BEDSIDE;  Surgeon: Thayer Headings, MD;  Location: [facility];  Service: Cardiovascular;  Laterality: N/A;  . ELECTROPHYSIOLOGIC STUDY N/A 09/30/2016   Procedure: Atrial Fibrillation Ablation;  Surgeon: Thompson Grayer, MD;  Location: [facility] CV LAB;  Service: Cardiovascular;  Laterality: N/A;  . HERNIA REPAIR    . HERNIA REPAIR     BL inguinal hernia  . TEE WITHOUT CARDIOVERSION N/A 09/30/2016   Procedure: TRANSESOPHAGEAL ECHOCARDIOGRAM (TEE);  Surgeon: Fay Records, MD;  Location: [facility];  Service: Cardiovascular;  Laterality: N/A;    ROS: Review of Systems Negative except as stated above  PHYSICAL EXAM: BP (!) 154/92   Pulse 63   Temp (!) 97.3 F (36.3 C) (Oral) Comment: forehead  Resp 18   Ht 6' (1.829 m)   Wt 249 lb (112.9 kg)   SpO2 97%   BMI 33.77 kg/m   Wt Readings from Last 3 Encounters:  10/02/19 249 lb (112.9 kg)  03/02/17 240 lb 12.8 oz (109.2 kg)  12/27/16 243 lb 12.8 oz (110.6 kg)    Physical Exam   General appearance - alert, well appearing, middle-age overweight Caucasian male and in no distress Mental status - normal mood, behavior, speech, dress, motor  activity, and thought processes Eyes - pupils equal and reactive, extraocular eye movements intact Nose - normal and patent, no erythema, discharge or polyps Mouth - mucous membranes moist, pharynx normal without lesions Neck - supple, no significant adenopathy, no thyroid enlargement or nodules palpated Lymphatics -no cervical or axillary lymphadenopathy  chest - clear to auscultation, no wheezes, rales or rhonchi, symmetric air entry Heart - normal rate, regular rhythm, normal S1, S2, no murmurs, rubs, clicks or gallops Abdomen - obese, soft, nontender, nondistended, no masses or organomegaly Extremities -trace LE edema  CMP Latest Ref  Rng & Units 10/01/2016 09/30/2016 08/18/2016  Glucose 65 - 99 mg/dL 142(H) 122(H) 163(H)  BUN 6 - 20 mg/dL 12 18 24(H)  Creatinine 0.61 - 1.24 mg/dL 1.21 1.04 1.47(H)  Sodium 135 - 145 mmol/L 137 141 140  Potassium 3.5 - 5.1 mmol/L 4.0 4.5 4.2  Chloride 101 - 111 mmol/L 103 104 106  CO2 22 - 32 mmol/L 24 27 25   Calcium 8.9 - 10.3 mg/dL 8.1(L) 9.4 9.3  Total Protein 6.0 - 8.3 g/dL - - -  Total Bilirubin 0.3 - 1.2 mg/dL - - -  Alkaline Phos 39 - 117 U/L - - -  AST 0 - 37 U/L - - -  ALT 0 - 53 U/L - - -   Lipid Panel  No results found for: CHOL, TRIG, HDL, CHOLHDL, VLDL, LDLCALC, LDLDIRECT  CBC    Component Value Date/Time   WBC 6.2 09/30/2016 0825   RBC 5.47 09/30/2016 0825   HGB 15.9 09/30/2016 0825   HCT 46.7 09/30/2016 0825   PLT 184 09/30/2016 0825   MCV 85.4 09/30/2016 0825   MCH 29.1 09/30/2016 0825   MCHC 34.0 09/30/2016 0825   RDW 13.3 09/30/2016 0825   LYMPHSABS 2.1 07/27/2016 1628   MONOABS 0.8 07/27/2016 1628   EOSABS 0.1 07/27/2016 1628   BASOSABS 0.1 07/27/2016 1628    ASSESSMENT AND PLAN: 1. OSA on CPAP -Encourage consistent use of CPAP.   Prescription given for new facemask.  2. Alcohol use disorder, mild, abuse I went over the recommendations of no more than 2 standard drinks per day for men.  Strongly encouraged him  to cut back.  Went over health risks associated with excess alcohol consumption.  3. Elevated blood pressure reading DASH diet discussed and encouraged.  He will stop eating a pack of salted sunflower seeds every morning.  I gave him a prescription to get a blood pressure monitoring device.  If his insurance covers it he will get it.  I recommend checking the blood pressure at least twice a week and recording the numbers to bring in on next visit - CBC - Comprehensive metabolic panel - Lipid panel  4. Class 1 obesity due to excess calories without serious comorbidity with body mass index (BMI) of 33.0 to 33.9 in adult Dietary counseling given.  Printed information also provided. Encouraged him to get in some form of moderate intensity exercise several times a week.  I went over the goal of 150 minutes/week - CBC - Comprehensive metabolic panel - Lipid panel  5. Prostate cancer screening Discussed Prostate cancer screening.  He has family history of prostate cancer in his father.  He is agreeable to PSA being checked - PSA  6. Screening for colon cancer He will keep his appointment for colonoscopy next month    Patient was given the opportunity to ask questions.  Patient verbalized understanding of the plan and was able to repeat key elements of the plan.   Orders Placed This Encounter  Procedures  . CBC  . Comprehensive metabolic panel  . Lipid panel  . PSA     Requested Prescriptions    No prescriptions requested or ordered in this encounter    Return in about 1 month (around 11/02/2019) for recheck BP and determin whether med need - Dr. Juleen China.  Karle Plumber, MD, FACP

## 2019-10-02 NOTE — Patient Instructions (Signed)
Cut back on eating salty foods.  Check your blood pressure once a week and record results.  Bring log with you on next visit.   Cut back to no more than 2 standard alcoholic beverages a day. Follow a Healthy Eating Plan - You can do it! Limit sugary drinks.  Avoid sodas, sweet tea, sport or energy drinks, or fruit drinks.  Drink water, lo-fat milk, or diet drinks. Limit snack foods.   Cut back on candy, cake, cookies, chips, ice cream.  These are a special treat, only in small amounts. Eat plenty of vegetables.  Especially dark green, red, and orange vegetables. Aim for at least 3 servings a day. More is better! Include fruit in your daily diet.  Whole fruit is much healthier than fruit juice! Limit "white" bread, "white" pasta, "white" rice.   Choose "100% whole grain" products, brown or wild rice. Avoid fatty meats. Try "Meatless Monday" and choose eggs or beans one day a week.  When eating meat, choose lean meats like chicken, [place], and fish.  Grill, broil, or bake meats instead of frying, and eat poultry without the skin. Eat less salt.  Avoid frozen pizzas, frozen dinners and salty foods.  Use seasonings other than salt in cooking.  This can help blood pressure and keep you from swelling Beer, wine and liquor have calories.  If you can safely drink alcohol, limit to 1 drink per day for women, 2 drinks for men

## 2019-10-03 LAB — COMPREHENSIVE METABOLIC PANEL
ALT: 30 IU/L (ref 0–44)
AST: 22 IU/L (ref 0–40)
Albumin/Globulin Ratio: 1.8 (ref 1.2–2.2)
Albumin: 4.3 g/dL (ref 3.8–4.9)
Alkaline Phosphatase: 84 IU/L (ref 39–117)
BUN/Creatinine Ratio: 18 (ref 9–20)
BUN: 19 mg/dL (ref 6–24)
Bilirubin Total: 0.4 mg/dL (ref 0.0–1.2)
CO2: 28 mmol/L (ref 20–29)
Calcium: 9.3 mg/dL (ref 8.7–10.2)
Chloride: 101 mmol/L (ref 96–106)
Creatinine, Ser: 1.04 mg/dL (ref 0.76–1.27)
GFR calc Af Amer: 94 mL/min/{1.73_m2} (ref >59)
GFR calc non Af Amer: 81 mL/min/{1.73_m2} (ref >59)
Globulin, Total: 2.4 g/dL (ref 1.5–4.5)
Glucose: 92 mg/dL (ref 65–99)
Potassium: 4.8 mmol/L (ref 3.5–5.2)
Sodium: 142 mmol/L (ref 134–144)
Total Protein: 6.7 g/dL (ref 6.0–8.5)

## 2019-10-03 LAB — LIPID PANEL
Chol/HDL Ratio: 4.4 ratio (ref 0.0–5.0)
Cholesterol, Total: 231 mg/dL — ABNORMAL HIGH (ref 100–199)
HDL: 53 mg/dL (ref >39)
LDL Chol Calc (NIH): 130 mg/dL — ABNORMAL HIGH (ref 0–99)
Triglycerides: 273 mg/dL — ABNORMAL HIGH (ref 0–149)
VLDL Cholesterol Cal: 48 mg/dL — ABNORMAL HIGH (ref 5–40)

## 2019-10-03 LAB — CBC
Hematocrit: 47.2 % (ref 37.5–51.0)
Hemoglobin: 16.5 g/dL (ref 13.0–17.7)
MCH: 31.1 pg (ref 26.6–33.0)
MCHC: 35 g/dL (ref 31.5–35.7)
MCV: 89 fL (ref 79–97)
Platelets: 189 10*3/uL (ref 150–450)
RBC: 5.31 x10E6/uL (ref 4.14–5.80)
RDW: 13 % (ref 11.6–15.4)
WBC: 7.1 10*3/uL (ref 3.4–10.8)

## 2019-10-03 LAB — PSA: Prostate Specific Ag, Serum: 0.6 ng/mL (ref 0.0–4.0)

## 2019-10-03 NOTE — Progress Notes (Signed)
Let patient know that his kidney and liver function tests are normal.  Blood count is normal meaning no anemia.  PSA which is the screening test for prostate cancer is normal.  Lipid profile reveals elevated triglyceride, total and LDL cholesterol.  LDL cholesterol is 130 with goal being less than 100.  High cholesterol increases his risk for heart attack and strokes.  Healthy eating habits and regular exercise as discussed yesterday will help to lower cholesterol.  Ideally he should be started on medication to help lower his cholesterol.  However we will give him several months to work on lifestyle modification.  Cholesterol panel can be rechecked on next visit and if still not at goal he will need to be started on medication. The rest of this is for my information. The 10-year ASCVD risk score [name]., et al., 2013) is: 7.9%   Values used to calculate the score:     Age: 54 years     Sex: Male     Is Non-Hispanic African American: No     Diabetic: No     Tobacco smoker: No     Systolic Blood Pressure: 123456 mmHg     Is BP treated: No     HDL Cholesterol: 53 mg/dL     Total Cholesterol: 231 mg/dL

## 2019-10-04 NOTE — Progress Notes (Signed)
Patient notified of results & recommendations. Expressed understanding.

## 2019-10-17 ENCOUNTER — Other Ambulatory Visit: Payer: Self-pay

## 2019-10-17 ENCOUNTER — Ambulatory Visit (AMBULATORY_SURGERY_CENTER): Payer: Self-pay | Admitting: *Deleted

## 2019-10-17 VITALS — Temp 97.5°F | Ht 72.0 in | Wt 245.0 lb

## 2019-10-17 DIAGNOSIS — Z1211 Encounter for screening for malignant neoplasm of colon: Secondary | ICD-10-CM

## 2019-10-17 DIAGNOSIS — Z01818 Encounter for other preprocedural examination: Secondary | ICD-10-CM

## 2019-10-17 MED ORDER — NA SULFATE-K SULFATE-MG SULF 17.5-3.13-1.6 GM/177ML PO SOLN: 1.0000 | Freq: Once | ORAL | 0 refills | Status: AC

## 2019-10-17 NOTE — Progress Notes (Signed)

## 2019-10-24 ENCOUNTER — Other Ambulatory Visit: Payer: Self-pay | Admitting: Gastroenterology

## 2019-10-24 DIAGNOSIS — Z1159 Encounter for screening for other viral diseases: Secondary | ICD-10-CM | POA: Diagnosis not present

## 2019-10-25 ENCOUNTER — Encounter: Payer: Self-pay | Admitting: Gastroenterology

## 2019-10-25 LAB — SARS CORONAVIRUS 2 (TAT 6-24 HRS): SARS Coronavirus 2: NEGATIVE

## 2019-10-29 ENCOUNTER — Other Ambulatory Visit: Payer: Self-pay

## 2019-10-29 ENCOUNTER — Ambulatory Visit (AMBULATORY_SURGERY_CENTER): Payer: BC Managed Care – PPO | Admitting: Gastroenterology

## 2019-10-29 ENCOUNTER — Encounter: Payer: Self-pay | Admitting: Gastroenterology

## 2019-10-29 VITALS — BP 146/81 | HR 78 | Temp 97.3°F | Resp 13 | Ht 72.0 in | Wt 245.0 lb

## 2019-10-29 DIAGNOSIS — D123 Benign neoplasm of transverse colon: Secondary | ICD-10-CM

## 2019-10-29 DIAGNOSIS — D122 Benign neoplasm of ascending colon: Secondary | ICD-10-CM | POA: Diagnosis not present

## 2019-10-29 DIAGNOSIS — D125 Benign neoplasm of sigmoid colon: Secondary | ICD-10-CM

## 2019-10-29 DIAGNOSIS — Z1211 Encounter for screening for malignant neoplasm of colon: Secondary | ICD-10-CM

## 2019-10-29 MED ORDER — SODIUM CHLORIDE 0.9 % IV SOLN: 500.0000 mL | Freq: Once | INTRAVENOUS | Status: DC

## 2019-10-29 NOTE — Patient Instructions (Signed)
THank you for letting us take care of your healthcare needs today. Please see handouts given to you on Polyps and Hemorrhoids.    YOU HAD AN ENDOSCOPIC PROCEDURE TODAY AT [facility]:   Refer to the procedure report that was given to you for any specific questions about what was found during the examination.  If the procedure report does not answer your questions, please call your gastroenterologist to clarify.  If you requested that your care partner not be given the details of your procedure findings, then the procedure report has been included in a sealed envelope for you to review at your convenience later.  YOU SHOULD EXPECT: Some feelings of bloating in the abdomen. Passage of more gas than usual.  Walking can help get rid of the air that was put into your GI tract during the procedure and reduce the bloating. If you had a lower endoscopy (such as a colonoscopy or flexible sigmoidoscopy) you may notice spotting of blood in your stool or on the toilet paper. If you underwent a bowel prep for your procedure, you may not have a normal bowel movement for a few days.  Please Note:  You might notice some irritation and congestion in your nose or some drainage.  This is from the oxygen used during your procedure.  There is no need for concern and it should clear up in a day or so.  SYMPTOMS TO REPORT IMMEDIATELY:   Following lower endoscopy (colonoscopy or flexible sigmoidoscopy):  Excessive amounts of blood in the stool  Significant tenderness or worsening of abdominal pains  Swelling of the abdomen that is new, acute  Fever of 100F or higher   For urgent or emergent issues, a gastroenterologist can be reached at any hour by calling [phone].   DIET:  We do recommend a small meal at first, but then you may proceed to your regular diet.  Drink plenty of fluids but you should avoid alcoholic beverages for 24 hours.  ACTIVITY:  You should plan to take it easy for the  rest of today and you should NOT DRIVE or use heavy machinery until tomorrow (because of the sedation medicines used during the test).    FOLLOW UP: Our staff will call the number listed on your records 48-72 hours following your procedure to check on you and address any questions or concerns that you may have regarding the information given to you following your procedure. If we do not reach you, we will leave a message.  We will attempt to reach you two times.  During this call, we will ask if you have developed any symptoms of COVID 19. If you develop any symptoms (ie: fever, flu-like symptoms, shortness of breath, cough etc.) before then, please call [phone].  If you test positive for Covid 19 in the 2 weeks post procedure, please call and report this information to [place].    If any biopsies were taken you will be contacted by phone or by letter within the next 1-3 weeks.  Please call us at [phone] if you have not heard about the biopsies in 3 weeks.    SIGNATURES/CONFIDENTIALITY: You and/or your care partner have signed paperwork which will be entered into your electronic medical record.  These signatures attest to the fact that that the information above on your After Visit Summary has been reviewed and is understood.  Full responsibility of the confidentiality of this discharge information lies with you and/or your care-partner.

## 2019-10-29 NOTE — Progress Notes (Signed)
Called to room to assist during endoscopic procedure.  Patient ID and intended procedure confirmed with present staff. Received instructions for my participation in the procedure from the performing physician.  

## 2019-10-29 NOTE — Progress Notes (Signed)
VS by DT. Temp by LC 

## 2019-10-29 NOTE — Progress Notes (Signed)
Pt's states no medical or surgical changes since previsit or office visit. 

## 2019-10-29 NOTE — Progress Notes (Signed)
A and O x3. Report to RN. Tolerated MAC anesthesia well.

## 2019-10-29 NOTE — Op Note (Addendum)
[facility] Patient Name: Larry Evans Procedure Date: 10/29/2019 9:48 AM MRN: [id number] Endoscopist: Thornton Park MD, MD Age: 54 Referring MD:  Date of Birth: [birth year] Gender: Male Account #: [id number] Procedure:                Colonoscopy Indications:              Screening for colorectal malignant neoplasm, This                            is the patient's first colonoscopy                           No known family history of colon cancer or polyps Medicines:                Monitored Anesthesia Care Procedure:                Pre-Anesthesia Assessment:                           - Prior to the procedure, a History and Physical                            was performed, and patient medications and                            allergies were reviewed. The patient's tolerance of                            previous anesthesia was also reviewed. The risks                            and benefits of the procedure and the sedation                            options and risks were discussed with the patient.                            All questions were answered, and informed consent                            was obtained. Prior Anticoagulants: The patient has                            taken no previous anticoagulant or antiplatelet                            agents. ASA Grade Assessment: II - A patient with                            mild systemic disease. After reviewing the risks                            and benefits, the patient was deemed in  satisfactory condition to undergo the procedure.                           After obtaining informed consent, the colonoscope                            was passed under direct vision. Throughout the                            procedure, the patient's blood pressure, pulse, and                            oxygen saturations were monitored continuously. The                            Colonoscope was  introduced through the anus and                            advanced to the 1 cm into the ileum. A second                            forward view of the right colon was performed. The                            colonoscopy was performed without difficulty. The                            patient tolerated the procedure well. The quality                            of the bowel preparation was good. The terminal                            ileum, ileocecal valve, appendiceal orifice, and                            rectum were photographed. Scope In: 10:09:17 AM Scope Out: 10:25:19 AM Scope Withdrawal Time: 0 hours 12 minutes 27 seconds  Total Procedure Duration: 0 hours 16 minutes 2 seconds  Findings:                 The perianal and digital rectal examinations were                            normal.                           A 3 mm polyp was found in the sigmoid colon. The                            polyp was sessile. The polyp was removed with a                            cold snare. Resection and retrieval were complete.  Estimated blood loss was minimal.                           A 1 mm polyp was found in the ascending colon. The                            polyp was flat and on the tip of a fold. I was                            unable to remove the polyp with a snare. The polyp                            was removed with a cold biopsy forceps. Resection                            and retrieval were complete. Estimated blood loss                            was minimal.                           Non-bleeding internal hemorrhoids were found. The                            hemorrhoids were small.                           The exam was otherwise without abnormality on                            direct and retroflexion views. Complications:            No immediate complications. Estimated blood loss:                            Minimal. Estimated Blood Loss:      Estimated blood loss was minimal. Impression:               - One 3 mm polyp in the sigmoid colon, removed with                            a cold snare. Resected and retrieved.                           - One 1 mm polyp in the ascending colon, removed                            with a cold biopsy forceps. Resected and retrieved.                           - Non-bleeding internal hemorrhoids.                           - The examination was otherwise normal on direct  and retroflexion views. Recommendation:           - Patient has a contact number available for                            emergencies. The signs and symptoms of potential                            delayed complications were discussed with the                            patient. Return to normal activities tomorrow.                            Written discharge instructions were provided to the                            patient.                           - Resume previous diet.                           - Await pathology results.                           - Repeat colonoscopy date to be determined after                            pending pathology results are reviewed for                            surveillance.                           - Continue present medications. Thornton Park MD, MD 10/29/2019 10:31:36 AM This report has been signed electronically.

## 2019-10-31 ENCOUNTER — Encounter: Payer: Self-pay | Admitting: Gastroenterology

## 2019-10-31 ENCOUNTER — Telehealth: Payer: Self-pay | Admitting: *Deleted

## 2019-10-31 NOTE — Telephone Encounter (Signed)
  Follow up Call-  Call back number 10/29/2019  Post procedure Call Back phone  # [phone]  Permission to leave phone message Yes  Some recent data might be hidden     Patient questions:  Do you have a fever, pain , or abdominal swelling? No. Pain Score  0 *  Have you tolerated food without any problems? Yes.    Have you been able to return to your normal activities? Yes.    Do you have any questions about your discharge instructions: Diet   No. Medications  No. Follow up visit  No.  Do you have questions or concerns about your Care? No.  Actions: * If pain score is 4 or above: No action needed, pain <4.  1. Have you developed a fever since your procedure? no  2.   Have you had an respiratory symptoms (SOB or cough) since your procedure? no  3.   Have you tested positive for COVID 19 since your procedure no  4.   Have you had any family members/close contacts diagnosed with the COVID 19 since your procedure?  no   If yes to any of these questions please route to Joylene John, RN and Alphonsa Gin, Therapist, sports.

## 2019-11-01 ENCOUNTER — Ambulatory Visit: Payer: Self-pay | Admitting: Internal Medicine
